# Patient Record
Sex: Female | Born: 2005 | Hispanic: Yes | Marital: Single | Smoking: Never smoker
Health system: Southern US, Community
[De-identification: ages and names within clinical notes are randomized; demographics above are authoritative.]

## PROBLEM LIST (undated history)

## (undated) HISTORY — PX: CHOLECYSTECTOMY: SHX55

---
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
## 2023-11-18 ENCOUNTER — Inpatient Hospital Stay: Payer: MEDICAID

## 2023-11-18 ENCOUNTER — Other Ambulatory Visit: Payer: Self-pay

## 2023-11-18 ENCOUNTER — Inpatient Hospital Stay
Admission: EM | Admit: 2023-11-18 | Discharge: 2023-11-25 | DRG: 418 | Disposition: A | Discharge status: Home / Self Care | Payer: MEDICAID | Attending: Student | Admitting: Student

## 2023-11-18 ENCOUNTER — Emergency Department: Payer: MEDICAID

## 2023-11-18 DIAGNOSIS — I959 Hypotension, unspecified: Secondary | ICD-10-CM | POA: Diagnosis not present

## 2023-11-18 DIAGNOSIS — Z79899 Other long term (current) drug therapy: Secondary | ICD-10-CM

## 2023-11-18 DIAGNOSIS — E559 Vitamin D deficiency, unspecified: Secondary | ICD-10-CM | POA: Diagnosis present

## 2023-11-18 DIAGNOSIS — Z6836 Body mass index (BMI) 36.0-36.9, adult: Secondary | ICD-10-CM

## 2023-11-18 DIAGNOSIS — F79 Unspecified intellectual disabilities: Secondary | ICD-10-CM | POA: Diagnosis present

## 2023-11-18 DIAGNOSIS — R7401 Elevation of levels of liver transaminase levels: Secondary | ICD-10-CM | POA: Diagnosis present

## 2023-11-18 DIAGNOSIS — J9811 Atelectasis: Secondary | ICD-10-CM | POA: Diagnosis not present

## 2023-11-18 DIAGNOSIS — K219 Gastro-esophageal reflux disease without esophagitis: Secondary | ICD-10-CM | POA: Diagnosis present

## 2023-11-18 DIAGNOSIS — E66812 Obesity, class 2: Secondary | ICD-10-CM | POA: Diagnosis present

## 2023-11-18 DIAGNOSIS — E611 Iron deficiency: Secondary | ICD-10-CM | POA: Diagnosis present

## 2023-11-18 DIAGNOSIS — Z23 Encounter for immunization: Secondary | ICD-10-CM

## 2023-11-18 DIAGNOSIS — K2980 Duodenitis without bleeding: Secondary | ICD-10-CM | POA: Diagnosis present

## 2023-11-18 DIAGNOSIS — K8012 Calculus of gallbladder with acute and chronic cholecystitis without obstruction: Secondary | ICD-10-CM | POA: Diagnosis not present

## 2023-11-18 DIAGNOSIS — K8010 Calculus of gallbladder with chronic cholecystitis without obstruction: Secondary | ICD-10-CM | POA: Diagnosis present

## 2023-11-18 DIAGNOSIS — R7989 Other specified abnormal findings of blood chemistry: Secondary | ICD-10-CM

## 2023-11-18 DIAGNOSIS — K8020 Calculus of gallbladder without cholecystitis without obstruction: Secondary | ICD-10-CM | POA: Diagnosis present

## 2023-11-18 DIAGNOSIS — K8018 Calculus of gallbladder with other cholecystitis without obstruction: Secondary | ICD-10-CM | POA: Diagnosis not present

## 2023-11-18 DIAGNOSIS — K851 Biliary acute pancreatitis without necrosis or infection: Principal | ICD-10-CM | POA: Diagnosis present

## 2023-11-18 DIAGNOSIS — R188 Other ascites: Secondary | ICD-10-CM | POA: Diagnosis present

## 2023-11-18 DIAGNOSIS — R000 Tachycardia, unspecified: Secondary | ICD-10-CM | POA: Diagnosis not present

## 2023-11-18 DIAGNOSIS — E876 Hypokalemia: Secondary | ICD-10-CM | POA: Diagnosis present

## 2023-11-18 LAB — COMPREHENSIVE METABOLIC PANEL WITH GFR
ALT: 244 U/L — ABNORMAL HIGH (ref 0–44)
AST: 181 U/L — ABNORMAL HIGH (ref 15–41)
Albumin: 4.2 g/dL (ref 3.5–5.0)
Alkaline Phosphatase: 149 U/L — ABNORMAL HIGH (ref 38–126)
Anion gap: 11 (ref 5–15)
BUN: 8 mg/dL (ref 6–20)
CO2: 23 mmol/L (ref 22–32)
Calcium: 9.1 mg/dL (ref 8.9–10.3)
Chloride: 106 mmol/L (ref 98–111)
Creatinine, Ser: 0.69 mg/dL (ref 0.44–1.00)
GFR, Estimated: >60 mL/min (ref >60)
Glucose, Bld: 174 mg/dL — ABNORMAL HIGH (ref 70–99)
Potassium: 3.9 mmol/L (ref 3.5–5.1)
Sodium: 140 mmol/L (ref 135–145)
Total Bilirubin: 5.3 mg/dL — ABNORMAL HIGH (ref 0.0–1.2)
Total Protein: 8.1 g/dL (ref 6.5–8.1)

## 2023-11-18 LAB — CBC
HCT: 45.7 % (ref 36.0–46.0)
HCT: 43.6 % (ref 36.0–46.0)
Hemoglobin: 14.3 g/dL (ref 12.0–15.0)
Hemoglobin: 13.7 g/dL (ref 12.0–15.0)
MCH: 31 pg (ref 26.0–34.0)
MCH: 31.1 pg (ref 26.0–34.0)
MCHC: 31.3 g/dL (ref 30.0–36.0)
MCHC: 31.4 g/dL (ref 30.0–36.0)
MCV: 99.1 fL (ref 80.0–100.0)
MCV: 98.9 fL (ref 80.0–100.0)
Platelets: 226 K/uL (ref 150–400)
Platelets: 217 K/uL (ref 150–400)
RBC: 4.61 MIL/uL (ref 3.87–5.11)
RBC: 4.41 MIL/uL (ref 3.87–5.11)
RDW: 13.7 % (ref 11.5–15.5)
RDW: 13.8 % (ref 11.5–15.5)
WBC: 16 K/uL — ABNORMAL HIGH (ref 4.0–10.5)
WBC: 17.5 K/uL — ABNORMAL HIGH (ref 4.0–10.5)
nRBC: 0 % (ref 0.0–0.2)
nRBC: 0 % (ref 0.0–0.2)

## 2023-11-18 LAB — PROTIME-INR
INR: 1.1 (ref 0.8–1.2)
Prothrombin Time: 14.4 s (ref 11.4–15.2)

## 2023-11-18 LAB — SALICYLATE LEVEL: Salicylate Lvl: <7 mg/dL — ABNORMAL LOW (ref 7.0–30.0)

## 2023-11-18 LAB — ACETAMINOPHEN LEVEL: Acetaminophen (Tylenol), Serum: <10 ug/mL — ABNORMAL LOW (ref 10–30)

## 2023-11-18 LAB — HEPATITIS PANEL, ACUTE
HCV Ab: NONREACTIVE
Hep A IgM: NONREACTIVE
Hep B C IgM: NONREACTIVE
Hepatitis B Surface Ag: NONREACTIVE

## 2023-11-18 LAB — CREATININE, SERUM
Creatinine, Ser: 0.62 mg/dL (ref 0.44–1.00)
GFR, Estimated: >60 mL/min (ref >60)

## 2023-11-18 LAB — ETHANOL: Alcohol, Ethyl (B): <15 mg/dL (ref <15)

## 2023-11-18 LAB — BILIRUBIN, DIRECT: Bilirubin, Direct: 3 mg/dL — ABNORMAL HIGH (ref 0.0–0.2)

## 2023-11-18 LAB — LIPASE, BLOOD: Lipase: >2800 U/L — ABNORMAL HIGH (ref 11–51)

## 2023-11-18 MED ORDER — SODIUM CHLORIDE 0.9 % IV SOLN: INTRAVENOUS | Status: DC

## 2023-11-18 MED ORDER — SODIUM CHLORIDE 0.9 % IV BOLUS
1000.0000 mL | Freq: Once | INTRAVENOUS | Status: AC
  Administered 2023-11-18: 1000 mL via INTRAVENOUS

## 2023-11-18 MED ORDER — GADOBUTROL 1 MMOL/ML IV SOLN
7.0000 mL | Freq: Once | INTRAVENOUS | Status: AC | PRN
  Administered 2023-11-19: 7 mL via INTRAVENOUS

## 2023-11-18 MED ORDER — ONDANSETRON HCL 4 MG/2ML IJ SOLN
4.0000 mg | Freq: Four times a day (QID) | INTRAMUSCULAR | Status: DC | PRN
  Administered 2023-11-19 – 2023-11-23 (×8): 4 mg via INTRAVENOUS
  Filled 2023-11-18 (×8): qty 2

## 2023-11-18 MED ORDER — PANTOPRAZOLE SODIUM 40 MG IV SOLR
40.0000 mg | Freq: Once | INTRAVENOUS | Status: AC
  Administered 2023-11-18: 40 mg via INTRAVENOUS
  Filled 2023-11-18: qty 10

## 2023-11-18 MED ORDER — HYDROMORPHONE HCL 1 MG/ML IJ SOLN
0.5000 mg | Freq: Once | INTRAMUSCULAR | Status: AC
  Administered 2023-11-18: 0.5 mg via INTRAVENOUS
  Filled 2023-11-18: qty 0.5

## 2023-11-18 MED ORDER — PIPERACILLIN-TAZOBACTAM 3.375 G IVPB
3.3750 g | Freq: Three times a day (TID) | INTRAVENOUS | Status: DC
  Administered 2023-11-19 – 2023-11-25 (×20): 3.375 g via INTRAVENOUS
  Filled 2023-11-18 (×20): qty 50

## 2023-11-18 MED ORDER — MORPHINE SULFATE (PF) 2 MG/ML IV SOLN
2.0000 mg | INTRAVENOUS | Status: DC | PRN
  Administered 2023-11-18: 2 mg via INTRAVENOUS
  Filled 2023-11-18: qty 1

## 2023-11-18 MED ORDER — ACETAMINOPHEN 325 MG PO TABS
650.0000 mg | ORAL_TABLET | Freq: Four times a day (QID) | ORAL | Status: DC | PRN
  Administered 2023-11-20 – 2023-11-24 (×8): 650 mg via ORAL
  Filled 2023-11-18 (×8): qty 2

## 2023-11-18 MED ORDER — ENOXAPARIN SODIUM 40 MG/0.4ML IJ SOSY
40.0000 mg | PREFILLED_SYRINGE | INTRAMUSCULAR | Status: DC
  Administered 2023-11-18 – 2023-11-24 (×7): 40 mg via SUBCUTANEOUS
  Filled 2023-11-18 (×7): qty 0.4

## 2023-11-18 MED ORDER — HYDROMORPHONE HCL 1 MG/ML IJ SOLN
0.5000 mg | INTRAMUSCULAR | Status: DC | PRN
  Administered 2023-11-19 (×3): 0.5 mg via INTRAVENOUS
  Filled 2023-11-18 (×3): qty 0.5

## 2023-11-18 MED ORDER — PIPERACILLIN-TAZOBACTAM 3.375 G IVPB 30 MIN
3.3750 g | Freq: Once | INTRAVENOUS | Status: AC
  Administered 2023-11-18: 3.375 g via INTRAVENOUS
  Filled 2023-11-18: qty 50

## 2023-11-18 MED ORDER — ONDANSETRON HCL 4 MG/2ML IJ SOLN
4.0000 mg | Freq: Once | INTRAMUSCULAR | Status: AC
  Administered 2023-11-18: 4 mg via INTRAVENOUS
  Filled 2023-11-18: qty 2

## 2023-11-18 NOTE — ED Notes (Addendum)
 RN completed pt protocols. Pt walked out of bathroom into the triage area. Pt is currently screaming and yelling triage that she is in pain. Pt spit in a emesis bag and then threw it on the floor. RN asked pt to please pick up bag, pt sts that she is not able to move and couldn't pick it up. RN asked pt again to pick it up, pt screamed it hurts and then picked up the bag. Pt is moaning and screaming. No tears or physical distress is visualized. VS are WNL

## 2023-11-18 NOTE — ED Triage Notes (Signed)
 First nurse note: Pt to ED via [facility] from home. Pt reports is not suppose to eat spicy foods and at [place] 2hrs ago and having abd pain. Pt groaning in pain. Pt has not been med compliant.    168/80 HR 70 97% RA
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
## 2023-11-18 NOTE — ED Notes (Addendum)
 Patient uncooperative when being asked assessment questions. Mom states sometimes the patient acts like this right before her menstrual cycle. Patient mother states she has a Nexplanon and does not get periods. Patient is groaning in pain, but will not verbalize where the pain is at.

## 2023-11-18 NOTE — H&P (Signed)
 History and Physical    Patient: Teresa Rasmussen FMW:[id number] DOB: 09-Feb-2006 DOA: 11/18/2023 DOS: the patient was seen and examined on 11/18/2023 PCP: [facility]  Patient coming from: Home  Chief Complaint:  Chief Complaint  Patient presents with   Abdominal Pain   HPI: Teresa Rasmussen is a 18 y.o. female with medical history significant of  intellectual disability who presented to the hospital with complaint of abdominal pain. According to her mother, the patient ate some chips(Takis) overnight and complained of abdominal pain prior to going to bed. However it appeared that her symptoms had resolved. Today the patient woke and stated that she had no eaten and started to have cramping abdominal pain which started in the top of her belly. She states that the pain was persistent and associated with nausea. Her sister was present at home with her and decided to call the ambulance and have her brought to the hospital for further evaluation.  In Er, BP 139/92, HR 79, RR 19, Spo2 100% on RA and Tmax 98.4. wbc 16, Hb.hct 14.3/45.7, plt 226. Na 140, K 3.9, Cl 106, bicarb 23, Bun/cr 8/0.69 and glucose 174. Alk phos 149, AST/ ALT 181/244  direct bilirubin 3 and total bilirubin 5.3. Acetaminophen  <10 and salicylate <7.  Abdominal ultrasound which demonstrated cholecystolithiasis with small volume biliary sludge. No changes of acute cholecystitis.  Review of Systems: As mentioned in the history of present illness. All other systems reviewed and are negative. History reviewed. No pertinent past medical history. History reviewed. No pertinent surgical history. Social History:  reports that she has never smoked. She has never used smokeless tobacco. She reports that she does not drink alcohol. No history on file for drug use.  No Known Allergies  History reviewed. No pertinent family history.  Prior to Admission medications   Medication Sig Start Date End Date Taking?  Authorizing Provider  MIRALAX 17 GM/SCOOP powder Take 17 g by mouth daily. 11/05/23  Yes [provider]  omeprazole (PRILOSEC) 20 MG capsule Take 20 mg by mouth 2 (two) times daily. 08/07/23  Yes [provider]  sertraline  (ZOLOFT ) 50 MG tablet Take 50 mg by mouth daily. 11/05/23  Yes [provider]    Physical Exam: Vitals:   11/18/23 1814 11/18/23 1819 11/18/23 1840 11/18/23 1914  BP:  128/88  (!) 139/92  Pulse:  62  79  Resp:  15  19  Temp: 98.4 F (36.9 C) 98.4 F (36.9 C)  98.4 F (36.9 C)  TempSrc: Oral Oral  Oral  SpO2:  100%  100%  Weight:   78.4 kg    Constitutional:      General: He is not in acute distress.    Appearance: Normal appearance.  HENT:     Head: Normocephalic and atraumatic.  Eyes:     Extraocular Movements: Extraocular movements intact.     Conjunctiva/sclera: Conjunctivae normal.     Pupils: Pupils are equal, round, and reactive to light.  Cardiovascular:     Rate and Rhythm: Normal rate and regular rhythm.     Pulses: Normal pulses.     Heart sounds: Normal heart sounds.  Pulmonary:     Effort: Pulmonary effort is normal. No respiratory distress.     Breath sounds: Normal breath sounds. No wheezing, rhonchi or rales.  Abdominal:     General: Abdomen is flat. Bowel sounds are normal. There is no distension.     Palpations: Abdomen is soft.  Tenderness: There was tenderness on palpation of the right upper quadrant  Musculoskeletal:        General: No deformity. Normal range of motion.  Skin:    General: Skin is warm and dry.     Coloration: Skin is not jaundiced.  Neurological:     General: No focal deficit present.     Mental Status: He is alert and oriented to person, place, and time. Mental status is at baseline.  Data Reviewed:  All results are documented above  Assessment and Plan: Patient Active Problem List   Diagnosis Date Noted   Cholelithiasis 11/18/2023     Cholelithiasis Patient will have MRCP  performed Currently started on zosyn  at this time Consult placed to gastroenterology and general surgery Patient has pain medication currently on board  Intellectual disability Somewhat complicates care     Advance Care Planning:   Code Status: Full Code   Consults: gastroenterology and general surgery   Family Communication:   Mitcheal Holly (Mother) [phone] (Home Phone)    Severity of Illness: The appropriate patient status for this patient is INPATIENT. Inpatient status is judged to be reasonable and necessary in order to provide the required intensity of service to ensure the patient's safety. The patient's presenting symptoms, physical exam findings, and initial radiographic and laboratory data in the context of their chronic comorbidities is felt to place them at high risk for further clinical deterioration. Furthermore, it is not anticipated that the patient will be medically stable for discharge from the hospital within 2 midnights of admission.   * I certify that at the point of admission it is my clinical judgment that the patient will require inpatient hospital care spanning beyond 2 midnights from the point of admission due to high intensity of service, high risk for further deterioration and high frequency of surveillance required.*  Author: Kalab Camps K Shali Vesey, MD 11/18/2023 7:41 PM  For on call review [email or web address].

## 2023-11-19 ENCOUNTER — Inpatient Hospital Stay: Payer: MEDICAID

## 2023-11-19 DIAGNOSIS — K851 Biliary acute pancreatitis without necrosis or infection: Secondary | ICD-10-CM | POA: Diagnosis not present

## 2023-11-19 DIAGNOSIS — K8020 Calculus of gallbladder without cholecystitis without obstruction: Secondary | ICD-10-CM | POA: Diagnosis not present

## 2023-11-19 LAB — URINALYSIS, ROUTINE W REFLEX MICROSCOPIC
Bilirubin Urine: NEGATIVE
Glucose, UA: NEGATIVE mg/dL
Hgb urine dipstick: NEGATIVE
Ketones, ur: 20 mg/dL — AB
Leukocytes,Ua: NEGATIVE
Nitrite: NEGATIVE
Protein, ur: NEGATIVE mg/dL
Specific Gravity, Urine: 1.016 (ref 1.005–1.030)
pH: 5 (ref 5.0–8.0)

## 2023-11-19 LAB — BASIC METABOLIC PANEL WITH GFR
Anion gap: 13 (ref 5–15)
BUN: 10 mg/dL (ref 6–20)
CO2: 23 mmol/L (ref 22–32)
Calcium: 8.7 mg/dL — ABNORMAL LOW (ref 8.9–10.3)
Chloride: 107 mmol/L (ref 98–111)
Creatinine, Ser: 0.73 mg/dL (ref 0.44–1.00)
GFR, Estimated: >60 mL/min (ref >60)
Glucose, Bld: 109 mg/dL — ABNORMAL HIGH (ref 70–99)
Potassium: 3.8 mmol/L (ref 3.5–5.1)
Sodium: 143 mmol/L (ref 135–145)

## 2023-11-19 LAB — HEPATIC FUNCTION PANEL
ALT: 179 U/L — ABNORMAL HIGH (ref 0–44)
AST: 74 U/L — ABNORMAL HIGH (ref 15–41)
Albumin: 4 g/dL (ref 3.5–5.0)
Alkaline Phosphatase: 134 U/L — ABNORMAL HIGH (ref 38–126)
Bilirubin, Direct: 0.6 mg/dL — ABNORMAL HIGH (ref 0.0–0.2)
Indirect Bilirubin: 1.4 mg/dL — ABNORMAL HIGH (ref 0.3–0.9)
Total Bilirubin: 2 mg/dL — ABNORMAL HIGH (ref 0.0–1.2)
Total Protein: 8 g/dL (ref 6.5–8.1)

## 2023-11-19 LAB — LIPID PANEL
Cholesterol: 152 mg/dL (ref 0–200)
HDL: 30 mg/dL — ABNORMAL LOW (ref >40)
LDL Cholesterol: 102 mg/dL — ABNORMAL HIGH (ref 0–99)
Total CHOL/HDL Ratio: 5.1 ratio
Triglycerides: 99 mg/dL (ref <150)
VLDL: 20 mg/dL (ref 0–40)

## 2023-11-19 LAB — URINE DRUG SCREEN, QUALITATIVE (ARMC ONLY)
Amphetamines, Ur Screen: NOT DETECTED
Barbiturates, Ur Screen: NOT DETECTED
Benzodiazepine, Ur Scrn: NOT DETECTED
Cannabinoid 50 Ng, Ur ~~LOC~~: NOT DETECTED
Cocaine Metabolite,Ur ~~LOC~~: NOT DETECTED
MDMA (Ecstasy)Ur Screen: NOT DETECTED
Methadone Scn, Ur: NOT DETECTED
Opiate, Ur Screen: POSITIVE — AB
Phencyclidine (PCP) Ur S: NOT DETECTED
Tricyclic, Ur Screen: NOT DETECTED

## 2023-11-19 LAB — CBC
HCT: 44.2 % (ref 36.0–46.0)
Hemoglobin: 14.1 g/dL (ref 12.0–15.0)
MCH: 31.1 pg (ref 26.0–34.0)
MCHC: 31.9 g/dL (ref 30.0–36.0)
MCV: 97.6 fL (ref 80.0–100.0)
Platelets: 190 K/uL (ref 150–400)
RBC: 4.53 MIL/uL (ref 3.87–5.11)
RDW: 14.2 % (ref 11.5–15.5)
WBC: 19.6 K/uL — ABNORMAL HIGH (ref 4.0–10.5)
nRBC: 0 % (ref 0.0–0.2)

## 2023-11-19 LAB — PHOSPHORUS: Phosphorus: 2.7 mg/dL (ref 2.5–4.6)

## 2023-11-19 LAB — LIPASE, BLOOD: Lipase: 1835 U/L — ABNORMAL HIGH (ref 11–51)

## 2023-11-19 LAB — PROCALCITONIN: Procalcitonin: 0.22 ng/mL

## 2023-11-19 LAB — MAGNESIUM: Magnesium: 2 mg/dL (ref 1.7–2.4)

## 2023-11-19 LAB — VITAMIN B12: Vitamin B-12: 289 pg/mL (ref 180–914)

## 2023-11-19 LAB — HIV ANTIBODY (ROUTINE TESTING W REFLEX): HIV Screen 4th Generation wRfx: NONREACTIVE

## 2023-11-19 LAB — FOLATE: Folate: 16.5 ng/mL (ref >5.9)

## 2023-11-19 MED ORDER — OXYCODONE HCL 5 MG PO TABS
5.0000 mg | ORAL_TABLET | Freq: Four times a day (QID) | ORAL | Status: DC | PRN
  Administered 2023-11-19 – 2023-11-25 (×14): 5 mg via ORAL
  Filled 2023-11-19 (×14): qty 1

## 2023-11-19 MED ORDER — SERTRALINE HCL 50 MG PO TABS
50.0000 mg | ORAL_TABLET | Freq: Every day | ORAL | Status: DC
  Administered 2023-11-19 – 2023-11-25 (×7): 50 mg via ORAL
  Filled 2023-11-19 (×9): qty 1

## 2023-11-19 MED ORDER — SODIUM CHLORIDE 0.9 % IV SOLN: INTRAVENOUS | Status: DC

## 2023-11-19 MED ORDER — DOCUSATE SODIUM 100 MG PO CAPS
100.0000 mg | ORAL_CAPSULE | Freq: Every day | ORAL | Status: DC | PRN
  Administered 2023-11-19 – 2023-11-21 (×2): 100 mg via ORAL
  Filled 2023-11-19 (×3): qty 1

## 2023-11-19 MED ORDER — INFLUENZA VIRUS VACC SPLIT PF (FLUZONE) 0.5 ML IM SUSY
0.5000 mL | PREFILLED_SYRINGE | INTRAMUSCULAR | Status: AC
  Administered 2023-11-22: 0.5 mL via INTRAMUSCULAR
  Filled 2023-11-19: qty 0.5

## 2023-11-19 MED ORDER — PANTOPRAZOLE SODIUM 40 MG IV SOLR
40.0000 mg | INTRAVENOUS | Status: DC
  Administered 2023-11-19 – 2023-11-22 (×4): 40 mg via INTRAVENOUS
  Filled 2023-11-19 (×4): qty 10

## 2023-11-19 MED ORDER — SODIUM CHLORIDE 0.9 % IV SOLN
INTRAVENOUS | Status: DC
  Administered 2023-11-20: 1000 mL via INTRAVENOUS

## 2023-11-19 MED ORDER — HYDROMORPHONE HCL 1 MG/ML IJ SOLN
0.5000 mg | INTRAMUSCULAR | Status: DC | PRN
  Administered 2023-11-19 – 2023-11-25 (×15): 0.5 mg via INTRAVENOUS
  Filled 2023-11-19 (×14): qty 0.5

## 2023-11-19 NOTE — Consult Note (Signed)
 [facility] SURGICAL CONSULTATION NOTE (initial) - cpt: 00746   HISTORY OF PRESENT ILLNESS (HPI):  18 y.o. female presented to [facility] ED yesterday for evaluation of abdominal pain. Patient reports epigastric abdominal pain since seeing a cramping sensation.  Seem to have followed eating chips, with abdominal pain at going to bed and awoke with pain persisting. Pain is perceived as nonradiating to the shoulder.  Currently per reports she has depended on pain medication which would not completely control her pain without frequent administration.  Lipase has diminished since admission however remains very high at >1800.   RCP has been obtained which confirms the presence of apparently uncomplicated acute pancreatitis diffusely, and yet with edema that involves the region of the gallbladder neck and bile ducts. Surgery is consulted by hospitalist physician Dr. Von in this context for evaluation and management of biliary pancreatitis, cholelithiasis.  PAST MEDICAL HISTORY (PMH):  History reviewed. No pertinent past medical history.   PAST SURGICAL HISTORY (PSH):  History reviewed. No pertinent surgical history.   MEDICATIONS:  Prior to Admission medications   Medication Sig Start Date End Date Taking? Authorizing Provider  MIRALAX 17 GM/SCOOP powder Take 17 g by mouth daily. 11/05/23  Yes [provider]  omeprazole (PRILOSEC) 20 MG capsule Take 20 mg by mouth 2 (two) times daily. 08/07/23  Yes [provider]  sertraline  (ZOLOFT ) 50 MG tablet Take 50 mg by mouth daily. 11/05/23  Yes [provider]     ALLERGIES:  No Known Allergies   SOCIAL HISTORY:  Social History   Socioeconomic History   Marital status: Single    Spouse name: Not on file   Number of children: Not on file   Years of education: Not on file   Highest education level: Not on file  Occupational History   Not on file  Tobacco Use   Smoking status: Never   Smokeless tobacco:  Never  Substance and Sexual Activity   Alcohol use: No   Drug use: Not on file   Sexual activity: Not on file  Other Topics Concern   Not on file  Social History Narrative   Not on file   Social Drivers of Health   Financial Resource Strain: Not on file  Food Insecurity: No Food Insecurity (11/18/2023)   Hunger Vital Sign    Worried About Running Out of Food in the Last Year: Never true    Ran Out of Food in the Last Year: Never true  Transportation Needs: No Transportation Needs (11/18/2023)   PRAPARE - Administrator, Civil Service (Medical): No    Lack of Transportation (Non-Medical): No  Physical Activity: Not on file  Stress: Not on file  Social Connections: Not on file  Intimate Partner Violence: Not At Risk (11/18/2023)   Humiliation, Afraid, Rape, and Kick questionnaire    Fear of Current or Ex-Partner: No    Emotionally Abused: No    Physically Abused: No    Sexually Abused: No     FAMILY HISTORY:  History reviewed. No pertinent family history.    REVIEW OF SYSTEMS:  Review of Systems  All other systems reviewed and are negative.   VITAL SIGNS:  Temp:  [97.5 F (36.4 C)-99.6 F (37.6 C)] 98.3 F (36.8 C) (09/24 1945) Pulse Rate:  [85-105] 99 (09/24 1945) Resp:  [16-18] 16 (09/24 1945) BP: (108-128)/(67-85) 108/67 (09/24 1945) SpO2:  [97 %-100 %] 99 % (09/24 1945)     Height: 4' 10 (147.3  cm) Weight: 78.4 kg     INTAKE/OUTPUT:  09/23 0701 - 09/24 0700 In: 1100 [IV Piggyback:1100] Out: -   PHYSICAL EXAM:  Physical Exam Blood pressure 108/67, pulse 99, temperature 98.3 F (36.8 C), temperature source Oral, resp. rate 16, height 4' 10 (1.473 m), weight 78.4 kg, SpO2 99%. Last Weight  Most recent update: 11/18/2023  6:41 PM    Weight  78.4 kg (172 lb 14.4 oz)             CONSTITUTIONAL: Well developed, and nourished, appropriately responsive and aware without distress.  Eating ice chips on presentation. EYES: Sclera non-icteric.    EARS, NOSE, MOUTH AND THROAT: The oropharynx is clear. Oral mucosa is pink and moist.   Hearing is intact to voice.  NECK: Trachea is midline, and there is no jugular venous distension.  LYMPH NODES:  Lymph nodes in the neck are not enlarged. RESPIRATORY:  Lungs are clear, and breath sounds are equal bilaterally.   Normal respiratory effort without pathologic use of accessory muscles. CARDIOVASCULAR: Heart is regular in rate and rhythm.   Well perfused.  GI: The abdomen is tender in the epigastrium and right upper quadrant, otherwise lower abdomen is soft, nontender, and nondistended. There were no palpable masses. I did not appreciate hepatosplenomegaly. There were normal bowel sounds. MUSCULOSKELETAL:  Symmetrical muscle tone appreciated in all four extremities. Warm without edema.  SKIN: Skin turgor is normal. No pathologic skin lesions appreciated.  NEUROLOGIC:  Motor and sensation appear grossly normal.  Cranial nerves are grossly without defect. PSYCH:  Alert and oriented to person, place and time. Affect is appropriate for situation.  Data Reviewed I have personally reviewed what is currently available of the patient's imaging, recent labs and medical records.    Labs:     Latest Ref Rng & Units 11/19/2023   10:12 AM 11/18/2023    7:20 PM 11/18/2023    2:17 PM  CBC  WBC 4.0 - 10.5 K/uL 19.6  17.5  16.0   Hemoglobin 12.0 - 15.0 g/dL 85.8  86.2  85.6   Hematocrit 36.0 - 46.0 % 44.2  43.6  45.7   Platelets 150 - 400 K/uL 190  217  226       Latest Ref Rng & Units 11/19/2023   10:12 AM 11/18/2023    7:20 PM 11/18/2023    2:17 PM  CMP  Glucose 70 - 99 mg/dL 890   825   BUN 6 - 20 mg/dL 10   8   Creatinine 9.55 - 1.00 mg/dL 9.26  9.37  9.30   Sodium 135 - 145 mmol/L 143   140   Potassium 3.5 - 5.1 mmol/L 3.8   3.9   Chloride 98 - 111 mmol/L 107   106   CO2 22 - 32 mmol/L 23   23   Calcium 8.9 - 10.3 mg/dL 8.7   9.1   Total Protein 6.5 - 8.1 g/dL 8.0   8.1   Total Bilirubin 0.0 -  1.2 mg/dL 2.0   5.3   Alkaline Phos 38 - 126 U/L 134   149   AST 15 - 41 U/L 74   181   ALT 0 - 44 U/L 179   244      Imaging studies:   Last 24 hrs: MR ABDOMEN MRCP W WO CONTAST Result Date: 11/19/2023 CLINICAL DATA:  Cholelithiasis. Follow-up MRCP inpatient with cholelithiasis. EXAM: MRI ABDOMEN WITHOUT AND WITH CONTRAST (INCLUDING MRCP) TECHNIQUE: Multiplanar multisequence MR  imaging of the abdomen was performed both before and after the administration of intravenous contrast. Heavily T2-weighted images of the biliary and pancreatic ducts were obtained, and three-dimensional MRCP images were rendered by post processing. CONTRAST:  7mL GADAVIST  GADOBUTROL  1 MMOL/ML IV SOLN COMPARISON:  Ultrasound abdomen from 11/18/2023. FINDINGS: Lower chest: Unremarkable MR appearance to the lung bases. No pleural effusion. No pericardial effusion. Normal heart size. Hepatobiliary: The liver is normal in size. Noncirrhotic configuration. No focal mass. No intrahepatic or extrahepatic bile duct dilatation. No choledocholithiasis. Physiologically distended gallbladder. Small-to-moderate volume dependent gallstones and sludge. No abnormal wall thickening. There is trace pericholecystic fat stranding and fluid near the neck, which is likely secondary to pancreatitis described below. Pancreas: There is bulky and slightly heterogeneous pancreas exhibiting moderate peripancreatic fat stranding and fluid which extends along the bilateral paracolic gutters. No walled-off abscess. There is normal inhomogeneous enhancement of the entire pancreas, thereby excluding pancreatic necrosis. No suspicious pancreatic lesion. Main pancreatic duct is not dilated. The splenic vein, superior mesenteric vein and portal veins are patent and without significant stenosis. Spleen:  Within normal limits in size and appearance. No focal mass. Adrenals/Urinary Tract: Unremarkable adrenal glands. No hydroureteronephrosis. No suspicious renal mass.  Stomach/Bowel: Visualized portions within the abdomen are unremarkable. No disproportionate dilation of bowel loops. Vascular/Lymphatic: No pathologically enlarged lymph nodes identified. No abdominal aortic aneurysm demonstrated. There is mild ascites. Other: Limited evaluation of uterus and bilateral ovaries on coronal images, which appears grossly within normal limits. Musculoskeletal: No suspicious bone lesions identified. IMPRESSION: 1. Findings compatible with acute pancreatitis. No walled-off abscess or pancreatic necrosis. 2. Cholelithiasis without acute cholecystitis. No intra or extrahepatic bile duct dilation. No choledocholithiasis. 3. Mild ascites. Electronically Signed   By: Ree Molt M.D.   On: 11/19/2023 08:58     Assessment/Plan:  18 y.o. female with hostel pancreatitis, complicated by:  Patient Active Problem List   Diagnosis Date Noted   Cholelithiasis 11/18/2023    - She remains quite tender and with significant pain from her pancreatitis, therefore we will not consider elective cholecystectomy until further pain resolution and therefore clinical resolution of her pancreatitis has occurred.  The improvement in her lipase overnight is encouraging we will continue to follow.  - I do not believe she has to be kept n.p.o. or limited ice chips at this time and I have ordered a clear liquid diet.  - Labs anticipated for morning.  - DVT prophylaxis  All of the above findings and recommendations were discussed with the patient and  family(if present), and all of patient's and present family's questions were answered to their expressed satisfaction.  I personally spent a total of 45 minutes in the care of the patient today including preparing to see the patient, getting/reviewing separately obtained history, performing a medically appropriate exam/evaluation, placing orders, referring and communicating with other health care professionals, documenting clinical information in the  EHR, and independently interpreting results.    Thank you for the opportunity to participate in this patient's care.   -- Honor Leghorn, M.D., FACS 11/19/2023, 9:46 PM

## 2023-11-19 NOTE — Progress Notes (Signed)
 [facility] Progress Note  Patient: Teresa Rasmussen    FMW:[id number]  DOA: 11/18/2023     Date of Service: the patient was seen and examined on 11/19/2023  Chief Complaint  Patient presents with   Abdominal Pain   Brief hospital course: This is 18 years old female with intellectual disability, Nexplanon in place presented in the [facility] ED with complaining of abdominal pain.  ED workup: VS stable, afebrile BMP: BG 124 elevated Lipase >2800 Alk phos 149, AST 181, ALT 244 elevated.  TP 5.3, direct bili 3.0 CBC: Leukocytosis WBC 17.5 Hepatitis panel negative, EtOH <15, UA negative US  Abd: Cholecystolithiasis with small volume biliary sludge. No changes of acute cholecystitis. MRCP: 1. Findings compatible with acute pancreatitis. No walled-off abscess or pancreatic necrosis. 2. Cholelithiasis without acute cholecystitis. No intra or extrahepatic bile duct dilation. No choledocholithiasis. 3. Mild ascites.   Assessment and Plan:  # Cholelithiasis Continue as needed medication for pain control Keep n.p.o. Continue IV fluid General Surgery consulted   # Acute pancreatitis Continue prn meds for pain control Keep n.p.o. Continue IV fluid for hydration Trend lipase level   # Transaminitis secondary to cholelithiasis # Leukocytosis most likely reactive Procalcitonin negative Continue empiric antibiotics for now Trend LFTs and WBC count  # GERD on PPI  # Obesity class II Body mass index is 36.14 kg/m.  Interventions:Calorie restricted diet and daily exercise advised to lose body weight.  Lifestyle modification discussed.   Diet: NPO DVT Prophylaxis: Subcutaneous Lovenox    Advance goals of care discussion: Full code  Family Communication: family was present at bedside, at the time of interview.  The pt provided permission to discuss medical plan with the family. Opportunity was given to ask question and all questions were answered satisfactorily.   Disposition:   Pt is from Home, admitted with cholelithiasis, pancreatitis and transaminitis, still has intractable pain and electrolyte imbalance, which precludes a safe discharge. Discharge to home, when stable, may need few days to improve.  Subjective: No significant events overnight, patient is still having intractable pain which is getting better with IV Dilaudid .  Patient is still feeling nauseous, denied any chest pain or palpitations, no shortness of breath.  Physical Exam: General: NAD, lying comfortably Appear in no distress, affect appropriate Eyes: PERRLA ENT: Oral Mucosa Clear, moist  Neck: no JVD,  Cardiovascular: S1 and S2 Present, no Murmur,  Respiratory: good respiratory effort, Bilateral Air entry equal and Decreased, no Crackles, no wheezes Abdomen: BS present, Soft and mild epigastric/ RUQ tenderness,  Skin: no rashes Extremities: no Pedal edema, no calf tenderness Neurologic: without any new focal findings Gait not checked due to patient safety concerns  Vitals:   11/18/23 1914 11/18/23 2017 11/19/23 0300 11/19/23 0914  BP: (!) 139/92 118/80 109/75 128/83  Pulse: 79 65 85 (!) 105  Resp: 19 20 16 18   Temp: 98.4 F (36.9 C) 98.6 F (37 C) 98.4 F (36.9 C) 99.6 F (37.6 C)  TempSrc: Oral Oral Oral Oral  SpO2: 100% 100% 100% 99%  Weight:      Height:  4' 10 (1.473 m)      Intake/Output Summary (Last 24 hours) at 11/19/2023 1417 Last data filed at 11/18/2023 1907 Gross per 24 hour  Intake 1100 ml  Output --  Net 1100 ml   Filed Weights   11/18/23 1840  Weight: 78.4 kg    Data Reviewed: I have personally reviewed and interpreted daily labs, tele strips, imagings as discussed above. I reviewed all nursing  notes, pharmacy notes, vitals, pertinent old records I have discussed plan of care as described above with RN and patient/family.  CBC: Recent Labs  Lab 11/18/23 1417 11/18/23 1920 11/19/23 1012  WBC 16.0* 17.5* 19.6*  HGB 14.3 13.7 14.1  HCT 45.7 43.6  44.2  MCV 99.1 98.9 97.6  PLT 226 217 190   Basic Metabolic Panel: Recent Labs  Lab 11/18/23 1417 11/18/23 1920 11/19/23 1012  NA 140  --  143  K 3.9  --  3.8  CL 106  --  107  CO2 23  --  23  GLUCOSE 174*  --  109*  BUN 8  --  10  CREATININE 0.69 0.62 0.73  CALCIUM 9.1  --  8.7*  MG  --   --  2.0  PHOS  --   --  2.7    Studies: MR ABDOMEN MRCP W WO CONTAST Result Date: 11/19/2023 CLINICAL DATA:  Cholelithiasis. Follow-up MRCP inpatient with cholelithiasis. EXAM: MRI ABDOMEN WITHOUT AND WITH CONTRAST (INCLUDING MRCP) TECHNIQUE: Multiplanar multisequence MR imaging of the abdomen was performed both before and after the administration of intravenous contrast. Heavily T2-weighted images of the biliary and pancreatic ducts were obtained, and three-dimensional MRCP images were rendered by post processing. CONTRAST:  7mL GADAVIST  GADOBUTROL  1 MMOL/ML IV SOLN COMPARISON:  Ultrasound abdomen from 11/18/2023. FINDINGS: Lower chest: Unremarkable MR appearance to the lung bases. No pleural effusion. No pericardial effusion. Normal heart size. Hepatobiliary: The liver is normal in size. Noncirrhotic configuration. No focal mass. No intrahepatic or extrahepatic bile duct dilatation. No choledocholithiasis. Physiologically distended gallbladder. Small-to-moderate volume dependent gallstones and sludge. No abnormal wall thickening. There is trace pericholecystic fat stranding and fluid near the neck, which is likely secondary to pancreatitis described below. Pancreas: There is bulky and slightly heterogeneous pancreas exhibiting moderate peripancreatic fat stranding and fluid which extends along the bilateral paracolic gutters. No walled-off abscess. There is normal inhomogeneous enhancement of the entire pancreas, thereby excluding pancreatic necrosis. No suspicious pancreatic lesion. Main pancreatic duct is not dilated. The splenic vein, superior mesenteric vein and portal veins are patent and without  significant stenosis. Spleen:  Within normal limits in size and appearance. No focal mass. Adrenals/Urinary Tract: Unremarkable adrenal glands. No hydroureteronephrosis. No suspicious renal mass. Stomach/Bowel: Visualized portions within the abdomen are unremarkable. No disproportionate dilation of bowel loops. Vascular/Lymphatic: No pathologically enlarged lymph nodes identified. No abdominal aortic aneurysm demonstrated. There is mild ascites. Other: Limited evaluation of uterus and bilateral ovaries on coronal images, which appears grossly within normal limits. Musculoskeletal: No suspicious bone lesions identified. IMPRESSION: 1. Findings compatible with acute pancreatitis. No walled-off abscess or pancreatic necrosis. 2. Cholelithiasis without acute cholecystitis. No intra or extrahepatic bile duct dilation. No choledocholithiasis. 3. Mild ascites. Electronically Signed   By: Ree Molt M.D.   On: 11/19/2023 08:58   US  ABDOMEN LIMITED RUQ (LIVER/GB) Result Date: 11/18/2023 CLINICAL DATA:  RUQ pain, elevated LFTs EXAM: ULTRASOUND ABDOMEN LIMITED RIGHT UPPER QUADRANT COMPARISON:  04/20/2018 FINDINGS: Gallbladder: Small volume biliary sludge with a few small gallstones. No wall thickening or pericholecystic fluid. No sonographic Murphy's sign noted by sonographer. Common bile duct: Diameter: 4 mm Liver: Normal echogenicity. No focal lesion identified. No intrahepatic biliary ductal dilation. Portal vein is patent on color Doppler imaging with normal direction of blood flow towards the liver. Other: None. IMPRESSION: Cholecystolithiasis with small volume biliary sludge. No changes of acute cholecystitis. Electronically Signed   By: Rogelia Myers M.D.   On: 11/18/2023 17:25  Scheduled Meds:  enoxaparin  (LOVENOX ) injection  40 mg Subcutaneous Q24H   [START ON 11/20/2023] influenza vac split trivalent PF  0.5 mL Intramuscular Tomorrow-1000   pantoprazole  (PROTONIX ) IV  40 mg Intravenous Q24H    sertraline   50 mg Oral Daily   Continuous Infusions:  sodium chloride  100 mL/hr at 11/19/23 1142   piperacillin -tazobactam (ZOSYN )  IV 3.375 g (11/19/23 1014)   PRN Meds: acetaminophen , docusate sodium , HYDROmorphone  (DILAUDID ) injection, ondansetron  (ZOFRAN ) IV, oxyCODONE   Time spent: 42 minutes  Author: ELVAN SOR. MD [facility] 11/19/2023 2:17 PM  To reach On-call, see care teams to locate the attending and reach out to them via [email or web address]. If 7PM-7AM, please contact night-coverage If you still have difficulty reaching the attending provider, please page the [facility] (Director on Call) for [facility] on amion for assistance.

## 2023-11-19 NOTE — Consult Note (Signed)
 Consultation  Referring Provider:  Hospitalist    Admit date: 11/18/2023 Consult date        11/19/2023 Reason for Consultation:  Abnormal liver enzymes            HPI:   Teresa Rasmussen is a 18 y.o. lady with supposed intellectual disability who presented with abdominal pain, elevated lft's, and evidence for pancreatitis. Patient states her symptoms have improved some but still with abdominal pain that responds to opioids. MRCP was negative for choledocholithiasis.   History reviewed. No pertinent past medical history.  History reviewed. No pertinent surgical history.  History reviewed. No pertinent family history.   Social History   Tobacco Use   Smoking status: Never   Smokeless tobacco: Never  Substance Use Topics   Alcohol use: No    Prior to Admission medications   Medication Sig Start Date End Date Taking? Authorizing Provider  MIRALAX 17 GM/SCOOP powder Take 17 g by mouth daily. 11/05/23  Yes [provider]  omeprazole (PRILOSEC) 20 MG capsule Take 20 mg by mouth 2 (two) times daily. 08/07/23  Yes [provider]  sertraline  (ZOLOFT ) 50 MG tablet Take 50 mg by mouth daily. 11/05/23  Yes [provider]    Current Facility-Administered Medications  Medication Dose Route Frequency Provider Last Rate Last Admin   0.9 %  sodium chloride  infusion   Intravenous Continuous Von Bellis, MD 100 mL/hr at 11/19/23 1142 New Bag at 11/19/23 1142   acetaminophen  (TYLENOL ) tablet 650 mg  650 mg Oral Q6H PRN Stephens, Tiona K, MD       docusate sodium  (COLACE) capsule 100 mg  100 mg Oral Daily PRN Stephens, Tiona K, MD       enoxaparin  (LOVENOX ) injection 40 mg  40 mg Subcutaneous Q24H Stephens, Tiona K, MD   40 mg at 11/18/23 2119   HYDROmorphone  (DILAUDID ) injection 0.5 mg  0.5 mg Intravenous Q4H PRN Von Bellis, MD       NOREEN ON 11/20/2023] influenza vac split trivalent PF (FLUZONE ) injection 0.5 mL  0.5 mL Intramuscular Tomorrow-1000 Stephens, Tiona  K, MD       ondansetron  (ZOFRAN ) injection 4 mg  4 mg Intravenous Q6H PRN Stephens, Tiona K, MD   4 mg at 11/19/23 0154   oxyCODONE  (Oxy IR/ROXICODONE ) immediate release tablet 5 mg  5 mg Oral Q6H PRN Von Bellis, MD   5 mg at 11/19/23 1311   pantoprazole  (PROTONIX ) injection 40 mg  40 mg Intravenous Q24H Von Bellis, MD   40 mg at 11/19/23 1011   piperacillin -tazobactam (ZOSYN ) IVPB 3.375 g  3.375 g Intravenous Q8H Stephens, Tiona K, MD 12.5 mL/hr at 11/19/23 1754 3.375 g at 11/19/23 1754   sertraline  (ZOLOFT ) tablet 50 mg  50 mg Oral Daily Von Bellis, MD   50 mg at 11/19/23 1031    Allergies as of 11/18/2023   (No Known Allergies)     Review of Systems:    All systems reviewed and negative except where noted in HPI.  Review of Systems  Constitutional:  Negative for chills and fever.  Cardiovascular:  Negative for leg swelling.  Gastrointestinal:  Positive for abdominal pain. Negative for blood in stool.  Skin:  Negative for rash.  Neurological:  Negative for focal weakness.  Psychiatric/Behavioral:  Negative for substance abuse.   All other systems reviewed and are negative.     Physical Exam:  Vital signs in last 24 hours: Temp:  [97.5 F (36.4 C)-99.6 F (37.6 C)] 97.5  F (36.4 C) (09/24 1719) Pulse Rate:  [62-105] 99 (09/24 1719) Resp:  [15-20] 18 (09/24 1719) BP: (109-139)/(75-92) 120/85 (09/24 1719) SpO2:  [97 %-100 %] 97 % (09/24 1719) Weight:  [78.4 kg] 78.4 kg (09/23 1840)   General:   Pleasant in NAD Head:  Normocephalic and atraumatic. Eyes:   No icterus.   Conjunctiva pink. Ears:  Normal auditory acuity. Mouth: Mucosa pink moist, no lesions. Neck:  Supple; no masses felt Lungs:  No respiratory distress Abdomen:   Flat, soft, nondistended, nontender Msk:  No clubbing or cyanosis. Strength 5/5. Symmetrical without gross deformities. Neurologic:  Alert and  oriented x4;  No focal deficits Skin:  Warm, dry, pink without significant lesions or  rashes. Psych:  Alert and cooperative. Normal affect.  LAB RESULTS: Recent Labs    11/18/23 1417 11/18/23 1920 11/19/23 1012  WBC 16.0* 17.5* 19.6*  HGB 14.3 13.7 14.1  HCT 45.7 43.6 44.2  PLT 226 217 190   BMET Recent Labs    11/18/23 1417 11/18/23 1920 11/19/23 1012  NA 140  --  143  K 3.9  --  3.8  CL 106  --  107  CO2 23  --  23  GLUCOSE 174*  --  109*  BUN 8  --  10  CREATININE 0.69 0.62 0.73  CALCIUM 9.1  --  8.7*   LFT Recent Labs    11/19/23 1012  PROT 8.0  ALBUMIN 4.0  AST 74*  ALT 179*  ALKPHOS 134*  BILITOT 2.0*  BILIDIR 0.6*  IBILI 1.4*   PT/INR Recent Labs    11/18/23 1641  LABPROT 14.4  INR 1.1    STUDIES: MR ABDOMEN MRCP W WO CONTAST Result Date: 11/19/2023 CLINICAL DATA:  Cholelithiasis. Follow-up MRCP inpatient with cholelithiasis. EXAM: MRI ABDOMEN WITHOUT AND WITH CONTRAST (INCLUDING MRCP) TECHNIQUE: Multiplanar multisequence MR imaging of the abdomen was performed both before and after the administration of intravenous contrast. Heavily T2-weighted images of the biliary and pancreatic ducts were obtained, and three-dimensional MRCP images were rendered by post processing. CONTRAST:  7mL GADAVIST  GADOBUTROL  1 MMOL/ML IV SOLN COMPARISON:  Ultrasound abdomen from 11/18/2023. FINDINGS: Lower chest: Unremarkable MR appearance to the lung bases. No pleural effusion. No pericardial effusion. Normal heart size. Hepatobiliary: The liver is normal in size. Noncirrhotic configuration. No focal mass. No intrahepatic or extrahepatic bile duct dilatation. No choledocholithiasis. Physiologically distended gallbladder. Small-to-moderate volume dependent gallstones and sludge. No abnormal wall thickening. There is trace pericholecystic fat stranding and fluid near the neck, which is likely secondary to pancreatitis described below. Pancreas: There is bulky and slightly heterogeneous pancreas exhibiting moderate peripancreatic fat stranding and fluid which  extends along the bilateral paracolic gutters. No walled-off abscess. There is normal inhomogeneous enhancement of the entire pancreas, thereby excluding pancreatic necrosis. No suspicious pancreatic lesion. Main pancreatic duct is not dilated. The splenic vein, superior mesenteric vein and portal veins are patent and without significant stenosis. Spleen:  Within normal limits in size and appearance. No focal mass. Adrenals/Urinary Tract: Unremarkable adrenal glands. No hydroureteronephrosis. No suspicious renal mass. Stomach/Bowel: Visualized portions within the abdomen are unremarkable. No disproportionate dilation of bowel loops. Vascular/Lymphatic: No pathologically enlarged lymph nodes identified. No abdominal aortic aneurysm demonstrated. There is mild ascites. Other: Limited evaluation of uterus and bilateral ovaries on coronal images, which appears grossly within normal limits. Musculoskeletal: No suspicious bone lesions identified. IMPRESSION: 1. Findings compatible with acute pancreatitis. No walled-off abscess or pancreatic necrosis. 2. Cholelithiasis without acute cholecystitis. No  intra or extrahepatic bile duct dilation. No choledocholithiasis. 3. Mild ascites. Electronically Signed   By: Ree Molt M.D.   On: 11/19/2023 08:58   US  ABDOMEN LIMITED RUQ (LIVER/GB) Result Date: 11/18/2023 CLINICAL DATA:  RUQ pain, elevated LFTs EXAM: ULTRASOUND ABDOMEN LIMITED RIGHT UPPER QUADRANT COMPARISON:  04/20/2018 FINDINGS: Gallbladder: Small volume biliary sludge with a few small gallstones. No wall thickening or pericholecystic fluid. No sonographic Murphy's sign noted by sonographer. Common bile duct: Diameter: 4 mm Liver: Normal echogenicity. No focal lesion identified. No intrahepatic biliary ductal dilation. Portal vein is patent on color Doppler imaging with normal direction of blood flow towards the liver. Other: None. IMPRESSION: Cholecystolithiasis with small volume biliary sludge. No changes of  acute cholecystitis. Electronically Signed   By: Rogelia Myers M.D.   On: 11/18/2023 17:25       Impression / Plan:   18 y/o lady who presents with gallstone pancreatitis. It appears the stone has passed based on MRCP  - recommend pain control - IV fluids - advance diet as tolerated - recommend surgery consult for consideration of cholecystectomy - daily CMP  Will likely follow peripherally, please call with any questions or concerns.  Frederic Schick MD, MPH [facility]

## 2023-11-19 NOTE — Plan of Care (Signed)

## 2023-11-20 DIAGNOSIS — K8020 Calculus of gallbladder without cholecystitis without obstruction: Secondary | ICD-10-CM | POA: Diagnosis not present

## 2023-11-20 DIAGNOSIS — K851 Biliary acute pancreatitis without necrosis or infection: Secondary | ICD-10-CM | POA: Diagnosis not present

## 2023-11-20 DIAGNOSIS — R7989 Other specified abnormal findings of blood chemistry: Secondary | ICD-10-CM

## 2023-11-20 LAB — LIPASE, BLOOD: Lipase: 684 U/L — ABNORMAL HIGH (ref 11–51)

## 2023-11-20 LAB — CBC
HCT: 39.9 % (ref 36.0–46.0)
Hemoglobin: 12.5 g/dL (ref 12.0–15.0)
MCH: 31.1 pg (ref 26.0–34.0)
MCHC: 31.3 g/dL (ref 30.0–36.0)
MCV: 99.3 fL (ref 80.0–100.0)
Platelets: 182 K/uL (ref 150–400)
RBC: 4.02 MIL/uL (ref 3.87–5.11)
RDW: 14.1 % (ref 11.5–15.5)
WBC: 15.8 K/uL — ABNORMAL HIGH (ref 4.0–10.5)
nRBC: 0 % (ref 0.0–0.2)

## 2023-11-20 LAB — BASIC METABOLIC PANEL WITH GFR
Anion gap: 10 (ref 5–15)
BUN: 8 mg/dL (ref 6–20)
CO2: 26 mmol/L (ref 22–32)
Calcium: 8.1 mg/dL — ABNORMAL LOW (ref 8.9–10.3)
Chloride: 102 mmol/L (ref 98–111)
Creatinine, Ser: 0.88 mg/dL (ref 0.44–1.00)
GFR, Estimated: >60 mL/min (ref >60)
Glucose, Bld: 120 mg/dL — ABNORMAL HIGH (ref 70–99)
Potassium: 3.6 mmol/L (ref 3.5–5.1)
Sodium: 138 mmol/L (ref 135–145)

## 2023-11-20 LAB — PHOSPHORUS: Phosphorus: 2.4 mg/dL — ABNORMAL LOW (ref 2.5–4.6)

## 2023-11-20 LAB — HEPATIC FUNCTION PANEL
ALT: 124 U/L — ABNORMAL HIGH (ref 0–44)
AST: 48 U/L — ABNORMAL HIGH (ref 15–41)
Albumin: 3.5 g/dL (ref 3.5–5.0)
Alkaline Phosphatase: 129 U/L — ABNORMAL HIGH (ref 38–126)
Bilirubin, Direct: 0.6 mg/dL — ABNORMAL HIGH (ref 0.0–0.2)
Indirect Bilirubin: 0.9 mg/dL (ref 0.3–0.9)
Total Bilirubin: 1.5 mg/dL — ABNORMAL HIGH (ref 0.0–1.2)
Total Protein: 7.2 g/dL (ref 6.5–8.1)

## 2023-11-20 LAB — MAGNESIUM: Magnesium: 2.2 mg/dL (ref 1.7–2.4)

## 2023-11-20 LAB — LACTIC ACID, PLASMA
Lactic Acid, Venous: 1.8 mmol/L (ref 0.5–1.9)
Lactic Acid, Venous: 1.2 mmol/L (ref 0.5–1.9)

## 2023-11-20 MED ORDER — SODIUM CHLORIDE 0.9 % IV BOLUS
1000.0000 mL | Freq: Once | INTRAVENOUS | Status: AC
  Administered 2023-11-20: 1000 mL via INTRAVENOUS

## 2023-11-20 MED ORDER — POTASSIUM PHOSPHATES 15 MMOLE/5ML IV SOLN
30.0000 mmol | Freq: Once | INTRAVENOUS | Status: AC
  Administered 2023-11-20: 30 mmol via INTRAVENOUS
  Filled 2023-11-20: qty 10

## 2023-11-20 MED ORDER — VITAMIN B-12 1000 MCG PO TABS
1000.0000 ug | ORAL_TABLET | Freq: Every day | ORAL | Status: DC
Start: 2023-11-20 — End: 2023-11-25
  Administered 2023-11-20 – 2023-11-25 (×6): 1000 ug via ORAL
  Filled 2023-11-20 (×7): qty 1

## 2023-11-20 NOTE — Plan of Care (Signed)
   Problem: Education: Goal: Knowledge of General Education information will improve Description: Including pain rating scale, medication(s)/side effects and non-pharmacologic comfort measures Outcome: Progressing   Problem: Activity: Goal: Risk for activity intolerance will decrease Outcome: Progressing   Problem: Nutrition: Goal: Adequate nutrition will be maintained Outcome: Progressing   Problem: Coping: Goal: Level of anxiety will decrease Outcome: Progressing

## 2023-11-20 NOTE — Progress Notes (Signed)
 [facility] Progress Note  Patient: Teresa Rasmussen    FMW:[id number]  DOA: 11/18/2023     Date of Service: the patient was seen and examined on 11/20/2023  Chief Complaint  Patient presents with   Abdominal Pain   Brief hospital course: This is 18 years old female with intellectual disability, Nexplanon in place presented in the [facility] ED with complaining of abdominal pain.  ED workup: VS stable, afebrile BMP: BG 124 elevated Lipase >2800 Alk phos 149, AST 181, ALT 244 elevated.  TP 5.3, direct bili 3.0 CBC: Leukocytosis WBC 17.5 Hepatitis panel negative, EtOH <15, UA negative US  Abd: Cholecystolithiasis with small volume biliary sludge. No changes of acute cholecystitis. MRCP: 1. Findings compatible with acute pancreatitis. No walled-off abscess or pancreatic necrosis. 2. Cholelithiasis without acute cholecystitis. No intra or extrahepatic bile duct dilation. No choledocholithiasis. 3. Mild ascites.   Assessment and Plan:  # Cholelithiasis Continue as needed medication for pain control Continue IV fluid General Surgery consulted 9/25 afebrile, blood cultures ordered, continue empiric antibiotics.   # Acute pancreatitis GI signed off, MRCP negative for CBD stone Continue prn meds for pain control Continue IV fluid for hydration Trend lipase level [id number] CLD  # Hypophosphatemia, Phos repleted with Check electrolytes daily and replete as needed.  # Tachycardia and hypotension could be due to IV pain medication Lactic acid 1.8 negative IV fluid bolus given Continue IV fluid  # Transaminitis secondary to cholelithiasis # Leukocytosis most likely reactive Procalcitonin negative Continue empiric antibiotics for now Trend LFTs and WBC count  # GERD on PPI  # Obesity class II Body mass index is 36.14 kg/m.  Interventions:Calorie restricted diet and daily exercise advised to lose body weight.  Lifestyle modification discussed.   Diet: CLD DVT  Prophylaxis: Subcutaneous Lovenox    Advance goals of care discussion: Full code  Family Communication: family was present at bedside, at the time of interview.  The pt provided permission to discuss medical plan with the family. Opportunity was given to ask question and all questions were answered satisfactorily.   Disposition:  Pt is from Home, admitted with cholelithiasis, pancreatitis and transaminitis, still has intractable pain and electrolyte imbalance, which precludes a safe discharge. Discharge to home, when stable, may need few days to improve.  Subjective: No significant events overnight, patient still having significant pain and requiring IV pain medications. Patient was also having headache in the morning could be due to hypotension, Denied any other complaints   Physical Exam: General: NAD, lying comfortably Appear in no distress, affect appropriate Eyes: PERRLA ENT: Oral Mucosa Clear, moist  Neck: no JVD,  Cardiovascular: S1 and S2 Present, no Murmur,  Respiratory: good respiratory effort, Bilateral Air entry equal and Decreased, no Crackles, no wheezes Abdomen: BS present, Soft and mild epigastric/ RUQ tenderness,  Skin: no rashes Extremities: no Pedal edema, no calf tenderness Neurologic: without any new focal findings Gait not checked due to patient safety concerns  Vitals:   11/20/23 0558 11/20/23 0632 11/20/23 0838 11/20/23 1155  BP:  (!) 95/58 (!) 94/58 104/64  Pulse:  (!) 107 (!) 116 (!) 125  Resp:  18 17 19   Temp: 99.2 F (37.3 C) 98.2 F (36.8 C) 98.6 F (37 C)   TempSrc: Oral Oral    SpO2:  93% (!) 89% 92%  Weight:      Height:        Intake/Output Summary (Last 24 hours) at 11/20/2023 1557 Last data filed at 11/20/2023 0600 Gross per 24 hour  Intake 580 ml  Output --  Net 580 ml   Filed Weights   11/18/23 1840  Weight: 78.4 kg    Data Reviewed: I have personally reviewed and interpreted daily labs, tele strips, imagings as discussed  above. I reviewed all nursing notes, pharmacy notes, vitals, pertinent old records I have discussed plan of care as described above with RN and patient/family.  CBC: Recent Labs  Lab 11/18/23 1417 11/18/23 1920 11/19/23 1012 11/20/23 0224  WBC 16.0* 17.5* 19.6* 15.8*  HGB 14.3 13.7 14.1 12.5  HCT 45.7 43.6 44.2 39.9  MCV 99.1 98.9 97.6 99.3  PLT 226 217 190 182   Basic Metabolic Panel: Recent Labs  Lab 11/18/23 1417 11/18/23 1920 11/19/23 1012 11/20/23 0224  NA 140  --  143 138  K 3.9  --  3.8 3.6  CL 106  --  107 102  CO2 23  --  23 26  GLUCOSE 174*  --  109* 120*  BUN 8  --  10 8  CREATININE 0.69 0.62 0.73 0.88  CALCIUM 9.1  --  8.7* 8.1*  MG  --   --  2.0 2.2  PHOS  --   --  2.7 2.4*    Studies: No results found.   Scheduled Meds:  vitamin B-12  1,000 mcg Oral Daily   enoxaparin  (LOVENOX ) injection  40 mg Subcutaneous Q24H   influenza vac split trivalent PF  0.5 mL Intramuscular Tomorrow-1000   pantoprazole  (PROTONIX ) IV  40 mg Intravenous Q24H   sertraline   50 mg Oral Daily   Continuous Infusions:  sodium chloride  1,000 mL (11/20/23 1427)   piperacillin -tazobactam (ZOSYN )  IV 3.375 g (11/20/23 1022)   PRN Meds: acetaminophen , docusate sodium , HYDROmorphone  (DILAUDID ) injection, ondansetron  (ZOFRAN ) IV, oxyCODONE   Time spent: 55 minutes  Author: ELVAN SOR. MD [facility] 11/20/2023 3:57 PM  To reach On-call, see care teams to locate the attending and reach out to them via [email or web address]. If 7PM-7AM, please contact night-coverage If you still have difficulty reaching the attending provider, please page the [facility] (Director on Call) for [facility] on amion for assistance.

## 2023-11-20 NOTE — Progress Notes (Signed)
 Teresa Copping, MD [facility]   [address], [address] [place], [place] [address] Phone: [phone] Fax : [phone]   Subjective: The patient is resting comfortably.  The patient is taking less pain medication today.  Her pancreatic enzyme pancreatic enzymes have come down.  Her liver functions have also decreased with her bilirubin down from 5.3 down to 1.5.  The patient is being seen by surgery for a cholecystectomy.  The patient's MRCP did not show any stones in the common bile duct.   Objective: Vital signs in last 24 hours: Vitals:   11/20/23 0558 11/20/23 0632 11/20/23 0838 11/20/23 1155  BP:  (!) 95/58 (!) 94/58 104/64  Pulse:  (!) 107 (!) 116 (!) 125  Resp:  18 17 19   Temp: 99.2 F (37.3 C) 98.2 F (36.8 C) 98.6 F (37 C)   TempSrc: Oral Oral    SpO2:  93% (!) 89% 92%  Weight:      Height:       Weight change:   Intake/Output Summary (Last 24 hours) at 11/20/2023 1413 Last data filed at 11/20/2023 0600 Gross per 24 hour  Intake 580 ml  Output --  Net 580 ml     Exam: General: Patient laying in bed in no apparent distress sleepy after having pain medication.   Lab Results: @LABTEST2 @ Micro Results: No results found for this or any previous visit (from the past 240 hours). Studies/Results: MR 3D Recon At Scanner Result Date: 11/19/2023 CLINICAL DATA:  Cholelithiasis. Follow-up MRCP inpatient with cholelithiasis. EXAM: MRI ABDOMEN WITHOUT AND WITH CONTRAST (INCLUDING MRCP) TECHNIQUE: Multiplanar multisequence MR imaging of the abdomen was performed both before and after the administration of intravenous contrast. Heavily T2-weighted images of the biliary and pancreatic ducts were obtained, and three-dimensional MRCP images were rendered by post processing. CONTRAST:  7mL GADAVIST  GADOBUTROL  1 MMOL/ML IV SOLN COMPARISON:  Ultrasound abdomen from 11/18/2023. FINDINGS: Lower chest: Unremarkable MR appearance to the lung bases. No pleural effusion. No pericardial  effusion. Normal heart size. Hepatobiliary: The liver is normal in size. Noncirrhotic configuration. No focal mass. No intrahepatic or extrahepatic bile duct dilatation. No choledocholithiasis. Physiologically distended gallbladder. Small-to-moderate volume dependent gallstones and sludge. No abnormal wall thickening. There is trace pericholecystic fat stranding and fluid near the neck, which is likely secondary to pancreatitis described below. Pancreas: There is bulky and slightly heterogeneous pancreas exhibiting moderate peripancreatic fat stranding and fluid which extends along the bilateral paracolic gutters. No walled-off abscess. There is normal inhomogeneous enhancement of the entire pancreas, thereby excluding pancreatic necrosis. No suspicious pancreatic lesion. Main pancreatic duct is not dilated. The splenic vein, superior mesenteric vein and portal veins are patent and without significant stenosis. Spleen:  Within normal limits in size and appearance. No focal mass. Adrenals/Urinary Tract: Unremarkable adrenal glands. No hydroureteronephrosis. No suspicious renal mass. Stomach/Bowel: Visualized portions within the abdomen are unremarkable. No disproportionate dilation of bowel loops. Vascular/Lymphatic: No pathologically enlarged lymph nodes identified. No abdominal aortic aneurysm demonstrated. There is mild ascites. Other: Limited evaluation of uterus and bilateral ovaries on coronal images, which appears grossly within normal limits. Musculoskeletal: No suspicious bone lesions identified. IMPRESSION: 1. Findings compatible with acute pancreatitis. No walled-off abscess or pancreatic necrosis. 2. Cholelithiasis without acute cholecystitis. No intra or extrahepatic bile duct dilation. No choledocholithiasis. 3. Mild ascites. Electronically Signed   By: Ree Molt M.D.   On: 11/19/2023 11:20   MR ABDOMEN MRCP W WO CONTAST Result Date: 11/19/2023 CLINICAL DATA:  Cholelithiasis. Follow-up MRCP  inpatient with cholelithiasis. EXAM: MRI ABDOMEN WITHOUT AND WITH CONTRAST (INCLUDING MRCP) TECHNIQUE: Multiplanar multisequence MR imaging of the abdomen was performed both before and after the administration of intravenous contrast. Heavily T2-weighted images of the biliary and pancreatic ducts were obtained, and three-dimensional MRCP images were rendered by post processing. CONTRAST:  7mL GADAVIST  GADOBUTROL  1 MMOL/ML IV SOLN COMPARISON:  Ultrasound abdomen from 11/18/2023. FINDINGS: Lower chest: Unremarkable MR appearance to the lung bases. No pleural effusion. No pericardial effusion. Normal heart size. Hepatobiliary: The liver is normal in size. Noncirrhotic configuration. No focal mass. No intrahepatic or extrahepatic bile duct dilatation. No choledocholithiasis. Physiologically distended gallbladder. Small-to-moderate volume dependent gallstones and sludge. No abnormal wall thickening. There is trace pericholecystic fat stranding and fluid near the neck, which is likely secondary to pancreatitis described below. Pancreas: There is bulky and slightly heterogeneous pancreas exhibiting moderate peripancreatic fat stranding and fluid which extends along the bilateral paracolic gutters. No walled-off abscess. There is normal inhomogeneous enhancement of the entire pancreas, thereby excluding pancreatic necrosis. No suspicious pancreatic lesion. Main pancreatic duct is not dilated. The splenic vein, superior mesenteric vein and portal veins are patent and without significant stenosis. Spleen:  Within normal limits in size and appearance. No focal mass. Adrenals/Urinary Tract: Unremarkable adrenal glands. No hydroureteronephrosis. No suspicious renal mass. Stomach/Bowel: Visualized portions within the abdomen are unremarkable. No disproportionate dilation of bowel loops. Vascular/Lymphatic: No pathologically enlarged lymph nodes identified. No abdominal aortic aneurysm demonstrated. There is mild ascites. Other:  Limited evaluation of uterus and bilateral ovaries on coronal images, which appears grossly within normal limits. Musculoskeletal: No suspicious bone lesions identified. IMPRESSION: 1. Findings compatible with acute pancreatitis. No walled-off abscess or pancreatic necrosis. 2. Cholelithiasis without acute cholecystitis. No intra or extrahepatic bile duct dilation. No choledocholithiasis. 3. Mild ascites. Electronically Signed   By: Ree Molt M.D.   On: 11/19/2023 08:58   US  ABDOMEN LIMITED RUQ (LIVER/GB) Result Date: 11/18/2023 CLINICAL DATA:  RUQ pain, elevated LFTs EXAM: ULTRASOUND ABDOMEN LIMITED RIGHT UPPER QUADRANT COMPARISON:  04/20/2018 FINDINGS: Gallbladder: Small volume biliary sludge with a few small gallstones. No wall thickening or pericholecystic fluid. No sonographic Murphy's sign noted by sonographer. Common bile duct: Diameter: 4 mm Liver: Normal echogenicity. No focal lesion identified. No intrahepatic biliary ductal dilation. Portal vein is patent on color Doppler imaging with normal direction of blood flow towards the liver. Other: None. IMPRESSION: Cholecystolithiasis with small volume biliary sludge. No changes of acute cholecystitis. Electronically Signed   By: Teresa Myers M.D.   On: 11/18/2023 17:25   Medications: I have reviewed the patient's current medications. Scheduled Meds:  vitamin B-12  1,000 mcg Oral Daily   enoxaparin  (LOVENOX ) injection  40 mg Subcutaneous Q24H   influenza vac split trivalent PF  0.5 mL Intramuscular Tomorrow-1000   pantoprazole  (PROTONIX ) IV  40 mg Intravenous Q24H   sertraline   50 mg Oral Daily   Continuous Infusions:  sodium chloride  100 mL/hr at 11/20/23 9295   piperacillin -tazobactam (ZOSYN )  IV 3.375 g (11/20/23 1022)   potassium PHOSPHATE  IVPB (in mmol) 30 mmol (11/20/23 0942)   PRN Meds:.acetaminophen , docusate sodium , HYDROmorphone  (DILAUDID ) injection, ondansetron  (ZOFRAN ) IV, oxyCODONE    Assessment: Principal Problem:    Cholelithiasis Active Problems:   Acute biliary pancreatitis without infection or necrosis    Plan: The patient had gallstone pancreatitis with the MRCP showing no stones in the common bile duct.  The patient is being followed by surgery for a cholecystectomy and to decide when the best timing  for that would be.  Since the patient has no signs of complications from the pancreatitis such as a phlegmon abscess or necrosis there is nothing further to do from a GI point of view and I do recommend proceeding with a cholecystectomy when surgery deems it appropriate.  I will sign off.  Please call if any further GI concerns or questions.  We would like to thank you for the opportunity to participate in the care of [facility].    LOS: 2 days   Teresa Copping, MD.FACG 11/20/2023, 2:13 PM Pager [phone] 7am-5pm  Check AMION for 5pm -7am coverage and on weekends

## 2023-11-20 NOTE — Progress Notes (Addendum)
 [facility] SURGICAL PROGRESS NOTE (cpt [phone])  Hospital Day(s): 2.   Interval History: Patient seen and examined, no acute events or new complaints overnight. She has been febrile in th last 24 hours. T-Max 100.36F at 0400. This morning she feels dizzy and has been diaphoretic. She does report he abdominal pain has improved compared to prior days. Some nausea but without emesis. Her WBC is improving; WBC 15.8K (from 19.6K yesterday), Hgb to 12.5, sCr normal at 0.88, bilirubin improving now 1.5, lipase improving as well 684. She does have blood Cx and venous lactic levels pending draw. She continues on Zosyn . She has been on CLD; no issues.   Review of Systems:  Constitutional: + fever, + diaphoresis  HEENT: denies cough or congestion  Respiratory: denies any shortness of breath  Cardiovascular: denies chest pain or palpitations  Gastrointestinal: + abdominal pain (improving), + nausea, denied emesis Genitourinary: denies burning with urination or urinary frequency Musculoskeletal: denies pain, decreased motor or sensation  Vital signs in last 24 hours: [min-max] current  Temp:  [97.5 F (36.4 C)-100.9 F (38.3 C)] 98.2 F (36.8 C) (09/25 9367) Pulse Rate:  [99-112] 107 (09/25 0632) Resp:  [16-18] 18 (09/25 9367) BP: (95-128)/(58-85) 95/58 (09/25 9367) SpO2:  [93 %-99 %] 93 % (09/25 9367)     Height: 4' 10 (147.3 cm) Weight: 78.4 kg     Intake/Output last 2 shifts:  09/24 0701 - 09/25 0700 In: 580 [P.O.:480; IV Piggyback:100] Out: -    Physical Exam:  Constitutional: alert, cooperative and no distress  HENT: normocephalic without obvious abnormality  Eyes: PERRL, EOM's grossly intact and symmetric  Respiratory: breathing non-labored at rest  Cardiovascular: regular rate and sinus rhythm  Gastrointestinal: soft, epigastric tenderness improving, and non-distended. She is not peritonitic    Labs:     Latest Ref Rng & Units 11/20/2023    2:24 AM 11/19/2023    10:12 AM 11/18/2023    7:20 PM  CBC  WBC 4.0 - 10.5 K/uL 15.8  19.6  17.5   Hemoglobin 12.0 - 15.0 g/dL 87.4  85.8  86.2   Hematocrit 36.0 - 46.0 % 39.9  44.2  43.6   Platelets 150 - 400 K/uL 182  190  217       Latest Ref Rng & Units 11/20/2023    2:24 AM 11/19/2023   10:12 AM 11/18/2023    7:20 PM  CMP  Glucose 70 - 99 mg/dL 879  890    BUN 6 - 20 mg/dL 8  10    Creatinine 9.55 - 1.00 mg/dL 9.11  9.26  9.37   Sodium 135 - 145 mmol/L 138  143    Potassium 3.5 - 5.1 mmol/L 3.6  3.8    Chloride 98 - 111 mmol/L 102  107    CO2 22 - 32 mmol/L 26  23    Calcium 8.9 - 10.3 mg/dL 8.1  8.7    Total Protein 6.5 - 8.1 g/dL 7.2  8.0    Total Bilirubin 0.0 - 1.2 mg/dL 1.5  2.0    Alkaline Phos 38 - 126 U/L 129  134    AST 15 - 41 U/L 48  74    ALT 0 - 44 U/L 124  179      Imaging studies: No new pertinent imaging studies   Assessment/Plan:  18 y.o. female with improving leukocytosis and hyperbilirubinemia admitted with gallstone pancreatitis without choledocholithiasis on MRCP   - Given her continued pain as  well as fever overnight, I do think it may be best to allow another 24 hours for treatment and resuscitation prior to proceeding with cholecystectomy. Anticipate we could proceed tomorrow (09/26). I will tentatively post for tomorrow to hold a spot. We will reassess in the AM for readiness as well.   - I will add bolus of NS this AM  - Follow up venous lactate and BCx  - I think it is reasonable to continue CLD for now; Will make NPO at midnight  - Continue IV Abx - Monitor abdominal examination; on-going bowel function - Monitor leukocytosis; improving - Monitor lipase; improving - Monitor hyperbilirubinemia; improving - Pain control prn; antiemetics prn   - Mobilize - Further management per primary service; we will follow along   All of the above findings and recommendations were discussed with the patient, and the medical team, and all of patient's questions were answered to  her expressed satisfaction.  -- Arthea Platt, PA-C [facility] 11/20/2023, 8:15 AM M-F: 7am - 4pm

## 2023-11-21 ENCOUNTER — Encounter
Admission: EM | Disposition: A | Discharge status: Home / Self Care | Payer: Self-pay | Source: Home / Self Care | Attending: Student

## 2023-11-21 ENCOUNTER — Inpatient Hospital Stay: Payer: MEDICAID

## 2023-11-21 ENCOUNTER — Other Ambulatory Visit: Payer: Self-pay

## 2023-11-21 DIAGNOSIS — K8018 Calculus of gallbladder with other cholecystitis without obstruction: Secondary | ICD-10-CM

## 2023-11-21 DIAGNOSIS — K8020 Calculus of gallbladder without cholecystitis without obstruction: Secondary | ICD-10-CM | POA: Diagnosis not present

## 2023-11-21 DIAGNOSIS — K851 Biliary acute pancreatitis without necrosis or infection: Secondary | ICD-10-CM | POA: Diagnosis not present

## 2023-11-21 LAB — LIPASE, BLOOD: Lipase: 93 U/L — ABNORMAL HIGH (ref 11–51)

## 2023-11-21 LAB — CBC
HCT: 34.7 % — ABNORMAL LOW (ref 36.0–46.0)
Hemoglobin: 11.1 g/dL — ABNORMAL LOW (ref 12.0–15.0)
MCH: 31.3 pg (ref 26.0–34.0)
MCHC: 32 g/dL (ref 30.0–36.0)
MCV: 97.7 fL (ref 80.0–100.0)
Platelets: 162 K/uL (ref 150–400)
RBC: 3.55 MIL/uL — ABNORMAL LOW (ref 3.87–5.11)
RDW: 13.9 % (ref 11.5–15.5)
WBC: 18.8 K/uL — ABNORMAL HIGH (ref 4.0–10.5)
nRBC: 0 % (ref 0.0–0.2)

## 2023-11-21 LAB — VITAMIN D 25 HYDROXY (VIT D DEFICIENCY, FRACTURES): Vit D, 25-Hydroxy: 24.01 ng/mL — ABNORMAL LOW (ref 30–100)

## 2023-11-21 LAB — BASIC METABOLIC PANEL WITH GFR
Anion gap: 9 (ref 5–15)
BUN: 5 mg/dL — ABNORMAL LOW (ref 6–20)
CO2: 24 mmol/L (ref 22–32)
Calcium: 7.6 mg/dL — ABNORMAL LOW (ref 8.9–10.3)
Chloride: 106 mmol/L (ref 98–111)
Creatinine, Ser: 0.54 mg/dL (ref 0.44–1.00)
GFR, Estimated: >60 mL/min (ref >60)
Glucose, Bld: 106 mg/dL — ABNORMAL HIGH (ref 70–99)
Potassium: 3.6 mmol/L (ref 3.5–5.1)
Sodium: 139 mmol/L (ref 135–145)

## 2023-11-21 LAB — IRON AND TIBC
Iron: 25 ug/dL — ABNORMAL LOW (ref 28–170)
Saturation Ratios: 9 % — ABNORMAL LOW (ref 10.4–31.8)
TIBC: 281 ug/dL (ref 250–450)
UIBC: 256 ug/dL

## 2023-11-21 LAB — HEPATIC FUNCTION PANEL
ALT: 77 U/L — ABNORMAL HIGH (ref 0–44)
AST: 23 U/L (ref 15–41)
Albumin: 2.9 g/dL — ABNORMAL LOW (ref 3.5–5.0)
Alkaline Phosphatase: 98 U/L (ref 38–126)
Bilirubin, Direct: 0.4 mg/dL — ABNORMAL HIGH (ref 0.0–0.2)
Indirect Bilirubin: 1 mg/dL — ABNORMAL HIGH (ref 0.3–0.9)
Total Bilirubin: 1.4 mg/dL — ABNORMAL HIGH (ref 0.0–1.2)
Total Protein: 6.4 g/dL — ABNORMAL LOW (ref 6.5–8.1)

## 2023-11-21 LAB — MAGNESIUM: Magnesium: 1.8 mg/dL (ref 1.7–2.4)

## 2023-11-21 LAB — PHOSPHORUS: Phosphorus: 2.3 mg/dL — ABNORMAL LOW (ref 2.5–4.6)

## 2023-11-21 SURGERY — CHOLECYSTECTOMY, ROBOT-ASSISTED, LAPAROSCOPIC
Anesthesia: General

## 2023-11-21 MED ORDER — ALBUTEROL SULFATE HFA 108 (90 BASE) MCG/ACT IN AERS
INHALATION_SPRAY | RESPIRATORY_TRACT | Status: DC | PRN
Start: 2023-11-21 — End: 2023-11-21
  Administered 2023-11-21: 6 via RESPIRATORY_TRACT

## 2023-11-21 MED ORDER — ROCURONIUM BROMIDE 10 MG/ML (PF) SYRINGE
PREFILLED_SYRINGE | INTRAVENOUS | Status: AC
  Filled 2023-11-21: qty 10

## 2023-11-21 MED ORDER — HYDRALAZINE HCL 20 MG/ML IJ SOLN
INTRAMUSCULAR | Status: AC
  Filled 2023-11-21: qty 1

## 2023-11-21 MED ORDER — CHLORHEXIDINE GLUCONATE 0.12 % MT SOLN: 15.0000 mL | Freq: Once | OROMUCOSAL | Status: DC

## 2023-11-21 MED ORDER — VITAMIN C 500 MG PO TABS
500.0000 mg | ORAL_TABLET | Freq: Every day | ORAL | Status: DC
  Administered 2023-11-22 – 2023-11-25 (×4): 500 mg via ORAL
  Filled 2023-11-21 (×5): qty 1

## 2023-11-21 MED ORDER — ACETAMINOPHEN 10 MG/ML IV SOLN
INTRAVENOUS | Status: AC
  Filled 2023-11-21: qty 100

## 2023-11-21 MED ORDER — PROPOFOL 1000 MG/100ML IV EMUL
INTRAVENOUS | Status: AC
  Filled 2023-11-21: qty 100

## 2023-11-21 MED ORDER — ROCURONIUM BROMIDE 10 MG/ML (PF) SYRINGE
PREFILLED_SYRINGE | INTRAVENOUS | Status: AC
Start: 2023-11-21 — End: 2023-11-21
  Filled 2023-11-21: qty 10

## 2023-11-21 MED ORDER — PIPERACILLIN-TAZOBACTAM 3.375 G IVPB
INTRAVENOUS | Status: AC
  Filled 2023-11-21: qty 50

## 2023-11-21 MED ORDER — FENTANYL CITRATE (PF) 100 MCG/2ML IJ SOLN
INTRAMUSCULAR | Status: AC
  Filled 2023-11-21: qty 2

## 2023-11-21 MED ORDER — HYDROMORPHONE HCL 1 MG/ML IJ SOLN
INTRAMUSCULAR | Status: AC
  Filled 2023-11-21: qty 1

## 2023-11-21 MED ORDER — ACETAMINOPHEN 10 MG/ML IV SOLN: 1000.0000 mg | Freq: Once | INTRAVENOUS | Status: DC | PRN

## 2023-11-21 MED ORDER — DEXAMETHASONE SODIUM PHOSPHATE 10 MG/ML IJ SOLN
INTRAMUSCULAR | Status: DC | PRN
  Administered 2023-11-21: 10 mg via INTRAVENOUS

## 2023-11-21 MED ORDER — DROPERIDOL 2.5 MG/ML IJ SOLN
INTRAMUSCULAR | Status: AC
  Filled 2023-11-21: qty 2

## 2023-11-21 MED ORDER — DROPERIDOL 2.5 MG/ML IJ SOLN
0.6250 mg | Freq: Once | INTRAMUSCULAR | Status: AC
  Administered 2023-11-21: 0.625 mg via INTRAVENOUS

## 2023-11-21 MED ORDER — MIDAZOLAM HCL 2 MG/2ML IJ SOLN
INTRAMUSCULAR | Status: AC
  Filled 2023-11-21: qty 2

## 2023-11-21 MED ORDER — LIDOCAINE HCL (PF) 2 % IJ SOLN
INTRAMUSCULAR | Status: AC
  Filled 2023-11-21: qty 5

## 2023-11-21 MED ORDER — KETOROLAC TROMETHAMINE 30 MG/ML IJ SOLN
INTRAMUSCULAR | Status: DC | PRN
  Administered 2023-11-21: 30 mg via INTRAVENOUS

## 2023-11-21 MED ORDER — KETOROLAC TROMETHAMINE 30 MG/ML IJ SOLN
INTRAMUSCULAR | Status: AC
  Filled 2023-11-21: qty 1

## 2023-11-21 MED ORDER — POLYSACCHARIDE IRON COMPLEX 150 MG PO CAPS
150.0000 mg | ORAL_CAPSULE | Freq: Every day | ORAL | Status: DC
  Administered 2023-11-22 – 2023-11-25 (×4): 150 mg via ORAL
  Filled 2023-11-21 (×5): qty 1

## 2023-11-21 MED ORDER — HYDROCORTISONE SOD SUC (PF) 100 MG IJ SOLR
INTRAMUSCULAR | Status: AC
  Filled 2023-11-21: qty 2

## 2023-11-21 MED ORDER — ACETAMINOPHEN 10 MG/ML IV SOLN
INTRAVENOUS | Status: DC | PRN
  Administered 2023-11-21: 1000 mg via INTRAVENOUS

## 2023-11-21 MED ORDER — VITAMIN D (ERGOCALCIFEROL) 1.25 MG (50000 UNIT) PO CAPS
50000.0000 [IU] | ORAL_CAPSULE | ORAL | Status: DC
  Administered 2023-11-21: 50000 [IU] via ORAL
  Filled 2023-11-21: qty 1

## 2023-11-21 MED ORDER — PHENYLEPHRINE 80 MCG/ML (10ML) SYRINGE FOR IV PUSH (FOR BLOOD PRESSURE SUPPORT)
PREFILLED_SYRINGE | INTRAVENOUS | Status: AC
  Filled 2023-11-21: qty 10

## 2023-11-21 MED ORDER — SODIUM CHLORIDE 0.9 % IV SOLN
12.5000 mg | Freq: Four times a day (QID) | INTRAVENOUS | Status: DC | PRN
  Administered 2023-11-21 – 2023-11-23 (×2): 12.5 mg via INTRAVENOUS
  Filled 2023-11-21 (×2): qty 12.5

## 2023-11-21 MED ORDER — LACTATED RINGERS IV SOLN: INTRAVENOUS | Status: DC

## 2023-11-21 MED ORDER — ONDANSETRON HCL 4 MG/2ML IJ SOLN
INTRAMUSCULAR | Status: DC | PRN
  Administered 2023-11-21: 4 mg via INTRAVENOUS

## 2023-11-21 MED ORDER — ROCURONIUM BROMIDE 100 MG/10ML IV SOLN
INTRAVENOUS | Status: DC | PRN
  Administered 2023-11-21: 50 mg via INTRAVENOUS

## 2023-11-21 MED ORDER — LIDOCAINE HCL (CARDIAC) PF 100 MG/5ML IV SOSY
PREFILLED_SYRINGE | INTRAVENOUS | Status: DC | PRN
  Administered 2023-11-21: 100 mg via INTRAVENOUS

## 2023-11-21 MED ORDER — INDOCYANINE GREEN 25 MG IV SOLR
INTRAVENOUS | Status: AC
  Filled 2023-11-21: qty 10

## 2023-11-21 MED ORDER — FENTANYL CITRATE (PF) 100 MCG/2ML IJ SOLN
INTRAMUSCULAR | Status: DC | PRN
  Administered 2023-11-21 (×2): 50 ug via INTRAVENOUS

## 2023-11-21 MED ORDER — BUPIVACAINE-EPINEPHRINE (PF) 0.25% -1:200000 IJ SOLN
INTRAMUSCULAR | Status: DC | PRN
  Administered 2023-11-21: 30 mL

## 2023-11-21 MED ORDER — FENTANYL CITRATE (PF) 100 MCG/2ML IJ SOLN: 25.0000 ug | INTRAMUSCULAR | Status: DC | PRN

## 2023-11-21 MED ORDER — 0.9 % SODIUM CHLORIDE (POUR BTL) OPTIME
TOPICAL | Status: DC | PRN
  Administered 2023-11-21: 500 mL

## 2023-11-21 MED ORDER — LACTATED RINGERS IV SOLN: INTRAVENOUS | Status: DC | PRN

## 2023-11-21 MED ORDER — ONDANSETRON HCL 4 MG/2ML IJ SOLN
INTRAMUSCULAR | Status: AC
  Filled 2023-11-21: qty 2

## 2023-11-21 MED ORDER — K PHOS MONO-SOD PHOS DI & MONO 155-852-130 MG PO TABS
500.0000 mg | ORAL_TABLET | Freq: Four times a day (QID) | ORAL | Status: AC
  Administered 2023-11-21: 500 mg via ORAL
  Filled 2023-11-21: qty 2

## 2023-11-21 MED ORDER — BUPIVACAINE-EPINEPHRINE (PF) 0.25% -1:200000 IJ SOLN
INTRAMUSCULAR | Status: AC
  Filled 2023-11-21: qty 30

## 2023-11-21 MED ORDER — MIDAZOLAM HCL 2 MG/2ML IJ SOLN
INTRAMUSCULAR | Status: DC | PRN
  Administered 2023-11-21: 2 mg via INTRAVENOUS

## 2023-11-21 MED ORDER — DEXAMETHASONE SODIUM PHOSPHATE 10 MG/ML IJ SOLN
INTRAMUSCULAR | Status: AC
  Filled 2023-11-21: qty 1

## 2023-11-21 MED ORDER — SUGAMMADEX SODIUM 200 MG/2ML IV SOLN
INTRAVENOUS | Status: DC | PRN
  Administered 2023-11-21: 200 mg via INTRAVENOUS

## 2023-11-21 MED ORDER — PROPOFOL 10 MG/ML IV BOLUS
INTRAVENOUS | Status: DC | PRN
  Administered 2023-11-21: 125 ug/kg/min via INTRAVENOUS
  Administered 2023-11-21: 150 ug/kg/min via INTRAVENOUS

## 2023-11-21 MED ORDER — CHLORHEXIDINE GLUCONATE 0.12 % MT SOLN
OROMUCOSAL | Status: AC
  Filled 2023-11-21: qty 15

## 2023-11-21 MED ORDER — INDOCYANINE GREEN 25 MG IV SOLR
1.2500 mg | INTRAVENOUS | Status: AC
  Administered 2023-11-21: 1.25 mg via INTRAVENOUS

## 2023-11-21 MED ORDER — PHENYLEPHRINE 80 MCG/ML (10ML) SYRINGE FOR IV PUSH (FOR BLOOD PRESSURE SUPPORT)
PREFILLED_SYRINGE | INTRAVENOUS | Status: DC | PRN
  Administered 2023-11-21: 160 ug via INTRAVENOUS
  Administered 2023-11-21 (×2): 80 ug via INTRAVENOUS
  Administered 2023-11-21: 160 ug via INTRAVENOUS
  Administered 2023-11-21: 80 ug via INTRAVENOUS

## 2023-11-21 SURGICAL SUPPLY — 38 items
BAG PRESSURE INF REUSE 3000 (BAG) IMPLANT
CLIP LIGATING HEM O LOK PURPLE (MISCELLANEOUS) ×1 IMPLANT
COVER TIP SHEARS 8 DVNC (MISCELLANEOUS) ×1 IMPLANT
DEFOGGER SCOPE WARM SEASHARP (MISCELLANEOUS) ×1 IMPLANT
DERMABOND ADVANCED .7 DNX12 (GAUZE/BANDAGES/DRESSINGS) ×1 IMPLANT
DRAPE ARM DVNC X/XI (DISPOSABLE) ×4 IMPLANT
DRAPE COLUMN DVNC XI (DISPOSABLE) ×1 IMPLANT
ELECTRODE REM PT RTRN 9FT ADLT (ELECTROSURGICAL) ×1 IMPLANT
FORCEPS BPLR R/ABLATION 8 DVNC (INSTRUMENTS) ×1 IMPLANT
FORCEPS PROGRASP DVNC XI (FORCEP) ×1 IMPLANT
GLOVE ORTHO TXT STRL SZ7.5 (GLOVE) ×2 IMPLANT
GOWN STRL REUS W/ TWL LRG LVL3 (GOWN DISPOSABLE) ×2 IMPLANT
GOWN STRL REUS W/ TWL XL LVL3 (GOWN DISPOSABLE) ×2 IMPLANT
GRASPER SUT TROCAR 14GX15 (MISCELLANEOUS) ×1 IMPLANT
IRRIGATION STRYKERFLOW (MISCELLANEOUS) IMPLANT
IRRIGATOR SUCT 8 DISP DVNC XI (IRRIGATION / IRRIGATOR) IMPLANT
KIT PINK PAD W/HEAD ARM REST (MISCELLANEOUS) ×1 IMPLANT
LABEL OR SOLS (LABEL) ×1 IMPLANT
MANIFOLD NEPTUNE II (INSTRUMENTS) ×1 IMPLANT
NDL HYPO 22X1.5 SAFETY MO (MISCELLANEOUS) ×1 IMPLANT
NDL INSUFFLATION 14GA 120MM (NEEDLE) ×1 IMPLANT
NEEDLE HYPO 22X1.5 SAFETY MO (MISCELLANEOUS) ×1 IMPLANT
NEEDLE INSUFFLATION 14GA 120MM (NEEDLE) ×1 IMPLANT
NS IRRIG 500ML POUR BTL (IV SOLUTION) ×1 IMPLANT
PACK LAP CHOLECYSTECTOMY (MISCELLANEOUS) ×1 IMPLANT
SCISSORS MNPLR CVD DVNC XI (INSTRUMENTS) ×1 IMPLANT
SEAL UNIV 5-12 XI (MISCELLANEOUS) ×4 IMPLANT
SET TUBE SMOKE EVAC HIGH FLOW (TUBING) ×1 IMPLANT
SOL .9 NS 3000ML IRR UROMATIC (IV SOLUTION) IMPLANT
SOLUTION ELECTROSURG ANTI STCK (MISCELLANEOUS) ×1 IMPLANT
SPIKE FLUID TRANSFER (MISCELLANEOUS) ×1 IMPLANT
SUT VICRYL 0 UR6 27IN ABS (SUTURE) ×1 IMPLANT
SUTURE MNCRL 4-0 27XMF (SUTURE) ×1 IMPLANT
SYRINGE TOOMEY IRRIG 70ML (MISCELLANEOUS) IMPLANT
SYSTEM BAG RETRIEVAL 10MM (BASKET) ×1 IMPLANT
TRAP FLUID SMOKE EVACUATOR (MISCELLANEOUS) ×1 IMPLANT
TROCAR Z-THREAD FIOS 11X100 BL (TROCAR) ×1 IMPLANT
WATER STERILE IRR 500ML POUR (IV SOLUTION) ×1 IMPLANT

## 2023-11-21 NOTE — Progress Notes (Signed)
   11/20/23 0423  Assess: MEWS Score  Temp (!) 100.9 F (38.3 C) (Tylenol  was administered at 0234)  BP 109/63  MAP (mmHg) 76  Pulse Rate (!) 112  Resp 18  SpO2 93 %  O2 Device Room Air  Assess: MEWS Score  MEWS Temp 1  MEWS Systolic 0  MEWS Pulse 2  MEWS RR 0  MEWS LOC 0  MEWS Score 3  MEWS Score Color Yellow  Assess: if the MEWS score is Yellow or Red  Were vital signs accurate and taken at a resting state? Yes  Does the patient meet 2 or more of the SIRS criteria? No  MEWS guidelines implemented  Yes, yellow  Treat  MEWS Interventions Considered administering scheduled or prn medications/treatments as ordered  Take Vital Signs  Increase Vital Sign Frequency  Yellow: Q2hr x1, continue Q4hrs until patient remains green for 12hrs  Escalate  MEWS: Escalate Yellow: Discuss with charge nurse and consider notifying provider and/or RRT  Notify: Charge Nurse/RN  Name of Charge Nurse/RN Notified Tolbert, RN  Provider Notification  Provider Name/Title Delayne Solian, MD  Date Provider Notified 11/20/23  Time Provider Notified 0430  Method of Notification Page  Notification Reason Other (Comment) (yellow mews)  Provider response Other (Comment) (Acknowledged. Patient is being treated with antibiotics due to biliary pancreatitis, cholelithiasis.)  Date of Provider Response 11/20/23  Time of Provider Response 0437  Assess: SIRS CRITERIA  SIRS Temperature  0  SIRS Respirations  0  SIRS Pulse 1  SIRS WBC 0  SIRS Score Sum  1

## 2023-11-21 NOTE — Progress Notes (Signed)
 [facility] Progress Note  Patient: Teresa Rasmussen    FMW:[id number]  DOA: 11/18/2023     Date of Service: the patient was seen and examined on 11/21/2023  Chief Complaint  Patient presents with   Abdominal Pain   Brief hospital course: This is 18 years old female with intellectual disability, Nexplanon in place presented in the [facility] ED with complaining of abdominal pain.  ED workup: VS stable, afebrile BMP: BG 124 elevated Lipase >2800 Alk phos 149, AST 181, ALT 244 elevated.  TP 5.3, direct bili 3.0 CBC: Leukocytosis WBC 17.5 Hepatitis panel negative, EtOH <15, UA negative US  Abd: Cholecystolithiasis with small volume biliary sludge. No changes of acute cholecystitis. MRCP: 1. Findings compatible with acute pancreatitis. No walled-off abscess or pancreatic necrosis. 2. Cholelithiasis without acute cholecystitis. No intra or extrahepatic bile duct dilation. No choledocholithiasis. 3. Mild ascites.   Assessment and Plan:  # Cholelithiasis Continue as needed medication for pain control Continue IV fluid General Surgery consulted 9/25 afebrile, blood cultures ordered, continue empiric antibiotics. 9/26 scheduled for lap chole today  # Acute pancreatitis GI signed off, MRCP negative for CBD stone Continue prn meds for pain control Continue IV fluid for hydration Trend lipase level 2800>>684> 93 CLD  # Hypophosphatemia, Phos repleted with Check electrolytes daily and replete as needed.  # Tachycardia and hypotension could be due to IV pain medication Lactic acid 1.8 negative IV fluid bolus given Continue IV fluid  # Transaminitis secondary to cholelithiasis # Leukocytosis could be reactive versus due to cholelithiasis Procalcitonin negative Continue empiric antibiotics for now Trend LFTs and WBC count  # GERD on PPI  # Vitamin B12 level 289, goal >400.  Started vitamin B12 oral supplement. Follow-up PCP to repeat vitamin B12 level after 3 to 6 months.  #  Iron  deficiency, transferrin saturation is 9% Elevated IV iron  due to possibility of acute infection Start oral iron  tablet with vitamin C  and follow with PCP to repeat iron  profile after 3 to 6 months.  # Obesity class II Body mass index is 36.14 kg/m.  Interventions:Calorie restricted diet and daily exercise advised to lose body weight.  Lifestyle modification discussed.   Diet: NPO MN for Lap-chole DVT Prophylaxis: Subcutaneous Lovenox    Advance goals of care discussion: Full code  Family Communication: family was present at bedside, at the time of interview.  The pt provided permission to discuss medical plan with the family. Opportunity was given to ask question and all questions were answered satisfactorily.   Disposition:  Pt is from Home, admitted with cholelithiasis, pancreatitis and transaminitis, still has intractable pain and electrolyte imbalance, which precludes a safe discharge. Discharge to home, when stable, may need few days to improve.  Subjective: No significant events overnight, patient was still having significant abdominal pain and she was about to go for lap chole and she was nervous.  Family was at bedside.   Physical Exam: General: NAD, lying comfortably Appear in mild-mod distress due to Abd pain, affect appropriate Eyes: PERRLA ENT: Oral Mucosa Clear, moist  Neck: no JVD,  Cardiovascular: S1 and S2 Present, no Murmur,  Respiratory: good respiratory effort, Bilateral Air entry equal and Decreased, no Crackles, no wheezes Abdomen: BS present, Soft and mild epigastric/ RUQ tenderness,  Skin: no rashes Extremities: no Pedal edema, no calf tenderness Neurologic: without any new focal findings Gait not checked due to patient safety concerns  Vitals:   11/21/23 0013 11/21/23 0506 11/21/23 0730 11/21/23 1121  BP: 128/81 (!) 99/59 108/66  124/87  Pulse: (!) 112 (!) 115 (!) 110 (!) 118  Resp: 20 18 17 18   Temp: 99.6 F (37.6 C) 98.7 F (37.1 C) 98.9 F  (37.2 C) 98.2 F (36.8 C)  TempSrc: Oral   Temporal  SpO2: 95% 92% 92% 95%  Weight:      Height:        Intake/Output Summary (Last 24 hours) at 11/21/2023 1328 Last data filed at 11/21/2023 1326 Gross per 24 hour  Intake 1550 ml  Output 5 ml  Net 1545 ml   Filed Weights   11/18/23 1840  Weight: 78.4 kg    Data Reviewed: I have personally reviewed and interpreted daily labs, tele strips, imagings as discussed above. I reviewed all nursing notes, pharmacy notes, vitals, pertinent old records I have discussed plan of care as described above with RN and patient/family.  CBC: Recent Labs  Lab 11/18/23 1417 11/18/23 1920 11/19/23 1012 11/20/23 0224 11/21/23 0538  WBC 16.0* 17.5* 19.6* 15.8* 18.8*  HGB 14.3 13.7 14.1 12.5 11.1*  HCT 45.7 43.6 44.2 39.9 34.7*  MCV 99.1 98.9 97.6 99.3 97.7  PLT 226 217 190 182 162   Basic Metabolic Panel: Recent Labs  Lab 11/18/23 1417 11/18/23 1920 11/19/23 1012 11/20/23 0224 11/21/23 0538  NA 140  --  143 138 139  K 3.9  --  3.8 3.6 3.6  CL 106  --  107 102 106  CO2 23  --  23 26 24   GLUCOSE 174*  --  109* 120* 106*  BUN 8  --  10 8 5*  CREATININE 0.69 0.62 0.73 0.88 0.54  CALCIUM 9.1  --  8.7* 8.1* 7.6*  MG  --   --  2.0 2.2 1.8  PHOS  --   --  2.7 2.4* 2.3*    Studies: No results found.   Scheduled Meds:  [MAR Hold] chlorhexidine   15 mL Mouth/Throat Once   [MAR Hold] vitamin B-12  1,000 mcg Oral Daily   [MAR Hold] enoxaparin  (LOVENOX ) injection  40 mg Subcutaneous Q24H   influenza vac split trivalent PF  0.5 mL Intramuscular Tomorrow-1000   [MAR Hold] pantoprazole  (PROTONIX ) IV  40 mg Intravenous Q24H   [MAR Hold] phosphorus  500 mg Oral QID   [MAR Hold] sertraline   50 mg Oral Daily   Continuous Infusions:  sodium chloride  1,000 mL (11/20/23 1427)   [MAR Hold] piperacillin -tazobactam (ZOSYN )  IV 3.375 g (11/21/23 0234)   [MAR Hold] promethazine  (PHENERGAN ) injection (IM or IVPB) 12.5 mg (11/21/23 0110)   PRN  Meds: 0.9 % irrigation (POUR BTL), [MAR Hold] acetaminophen , bupivacaine -epinephrine  (PF), [MAR Hold] docusate sodium , [MAR Hold]  HYDROmorphone  (DILAUDID ) injection, [MAR Hold] ondansetron  (ZOFRAN ) IV, [MAR Hold] oxyCODONE , [MAR Hold] promethazine  (PHENERGAN ) injection (IM or IVPB)  Time spent: 40 minutes  Author: ELVAN SOR. MD [facility] 11/21/2023 1:28 PM  To reach On-call, see care teams to locate the attending and reach out to them via [email or web address]. If 7PM-7AM, please contact night-coverage If you still have difficulty reaching the attending provider, please page the [facility] (Director on Call) for [facility] on amion for assistance.

## 2023-11-21 NOTE — H&P (View-Only) (Signed)
 [facility] SURGICAL PROGRESS NOTE  Hospital Day(s): 3.   Interval History:  Patient seen and examined No acute events or new complaints overnight. She does continue to have upper abdominal pain  No longer with fevers in the last 24 hours.  She does continue to have leukocytosis; WBC 18.8K this AM.  Hgb to 11.1.  Renal function normal; sCr - 0.54; UO - unmeasured.  No electrolyte derangements.  Hypophosphatemia to 2.3.  Total bilirubinemia to 1.4 (indirect 1.0).  Lipase is 91.  She is NPO this morning.  Continues on Zosyn    Vital signs in last 24 hours: [min-max] current  Temp:  [98.1 F (36.7 C)-99.6 F (37.6 C)] 98.7 F (37.1 C) (09/26 0506) Pulse Rate:  [112-132] 115 (09/26 0506) Resp:  [17-21] 18 (09/26 0506) BP: (89-128)/(52-81) 99/59 (09/26 0506) SpO2:  [87 %-95 %] 92 % (09/26 0506)     Height: 4' 10 (147.3 cm) Weight: 78.4 kg     Intake/Output last 2 shifts:  09/25 0701 - 09/26 0700 In: 1400 [I.V.:1200; IV Piggyback:200] Out: -    Physical Exam:  Constitutional: alert, cooperative and no distress  HENT: normocephalic without obvious abnormality  Eyes: PERRL, EOM's grossly intact and symmetric  Respiratory: breathing non-labored at rest  Cardiovascular: regular rate and sinus rhythm  Gastrointestinal: soft, epigastric tenderness, and non-distended. She is not peritonitic    Labs:     Latest Ref Rng & Units 11/21/2023    5:38 AM 11/20/2023    2:24 AM 11/19/2023   10:12 AM  CBC  WBC 4.0 - 10.5 K/uL 18.8  15.8  19.6   Hemoglobin 12.0 - 15.0 g/dL 88.8  87.4  85.8   Hematocrit 36.0 - 46.0 % 34.7  39.9  44.2   Platelets 150 - 400 K/uL 162  182  190       Latest Ref Rng & Units 11/21/2023    5:38 AM 11/20/2023    2:24 AM 11/19/2023   10:12 AM  CMP  Glucose 70 - 99 mg/dL 893  879  890   BUN 6 - 20 mg/dL 5  8  10    Creatinine 0.44 - 1.00 mg/dL 9.45  9.11  9.26   Sodium 135 - 145 mmol/L 139  138  143   Potassium 3.5 - 5.1 mmol/L 3.6  3.6  3.8    Chloride 98 - 111 mmol/L 106  102  107   CO2 22 - 32 mmol/L 24  26  23    Calcium 8.9 - 10.3 mg/dL 7.6  8.1  8.7   Total Protein 6.5 - 8.1 g/dL 6.4  7.2  8.0   Total Bilirubin 0.0 - 1.2 mg/dL 1.4  1.5  2.0   Alkaline Phos 38 - 126 U/L 98  129  134   AST 15 - 41 U/L 23  48  74   ALT 0 - 44 U/L 77  124  179     Imaging studies: No new pertinent imaging studies   Assessment/Plan:  18 y.o. female with gallstone pancreatitis without choledocholithiasis on MRCP   - We will plan to proceed with robotic assisted laparoscopic cholecystectomy this afternoon with Dr Lane pending OR/Anesthesia availability  - All risks, benefits, and alternatives to above procedure(s) were discussed with the patient and her family, all of their questions were answered to their expressed satisfaction, patient expresses she wishes to proceed, and informed consent was obtained.   - Continue NPO; okay sips with medications  - ICG on  call to OR  - Continue IV Abx (Zosyn ) - Monitor abdominal examination; on-going bowel function - Monitor leukocytosis; worsened - Monitor lipase; improving - Monitor hyperbilirubinemia; improving - Pain control prn; antiemetics prn   - Mobilize - Further management per primary service; we will follow along   All of the above findings and recommendations were discussed with the patient, her family, and the medical team, and all of patient's questions were answered to her expressed satisfaction.  -- Arthea Platt, PA-C [facility] 11/21/2023, 7:25 AM M-F: 7am - 4pm

## 2023-11-21 NOTE — Anesthesia Preprocedure Evaluation (Signed)
 Anesthesia Evaluation  Patient identified by MRN, date of birth, ID band Patient awake    Reviewed: Allergy & Precautions, NPO status , Patient's Chart, lab work & pertinent test results  History of Anesthesia Complications Negative for: history of anesthetic complications  Airway Mallampati: IV   Neck ROM: Full    Dental no notable dental hx.    Pulmonary neg pulmonary ROS   Pulmonary exam normal breath sounds clear to auscultation       Cardiovascular Exercise Tolerance: Good negative cardio ROS Normal cardiovascular exam Rhythm:Regular Rate:Normal     Neuro/Psych Intellectual disability    GI/Hepatic ,GERD  ,,  Endo/Other  Obesity   Renal/GU negative Renal ROS     Musculoskeletal   Abdominal   Peds  Hematology negative hematology ROS (+)   Anesthesia Other Findings   Reproductive/Obstetrics                              Anesthesia Physical Anesthesia Plan  ASA: 2  Anesthesia Plan: General   Post-op Pain Management:    Induction: Intravenous  PONV Risk Score and Plan: 3 and Ondansetron , Dexamethasone  and Treatment may vary due to age or medical condition  Airway Management Planned: Oral ETT  Additional Equipment:   Intra-op Plan:   Post-operative Plan: Extubation in OR  Informed Consent: I have reviewed the patients History and Physical, chart, labs and discussed the procedure including the risks, benefits and alternatives for the proposed anesthesia with the patient or authorized representative who has indicated his/her understanding and acceptance.     Dental advisory given and Consent reviewed with POA  Plan Discussed with: CRNA  Anesthesia Plan Comments: (Patient and mother at bedside consented for risks of anesthesia including but not limited to:  - adverse reactions to medications - damage to eyes, teeth, lips or other oral mucosa - nerve damage due to  positioning  - sore throat or hoarseness - damage to heart, brain, nerves, lungs, other parts of body or loss of life  Informed patient and mother about role of CRNA in peri- and intra-operative care; they voiced understanding.)        Anesthesia Quick Evaluation

## 2023-11-21 NOTE — Interval H&P Note (Signed)
 History and Physical Interval Note:  11/21/2023 11:46 AM  Teresa Rasmussen  has presented today for surgery, with the diagnosis of Gallstone pancreatitis.  The various methods of treatment have been discussed with the patient and family. After consideration of risks, benefits and other options for treatment, the patient has consented to  Procedure(s): CHOLECYSTECTOMY, ROBOT-ASSISTED, LAPAROSCOPIC (N/A) as a surgical intervention.  The patient's history has been reviewed, patient examined, no change in status, stable for surgery.  I have reviewed the patient's chart and labs.  Questions were answered to the patient's satisfaction.     Honor Leghorn

## 2023-11-21 NOTE — Plan of Care (Signed)

## 2023-11-21 NOTE — Plan of Care (Signed)

## 2023-11-21 NOTE — Transfer of Care (Signed)
 Immediate Anesthesia Transfer of Care Note  Patient: Teresa Rasmussen  Procedure(s) Performed: CHOLECYSTECTOMY, ROBOT-ASSISTED, LAPAROSCOPIC  Patient Location: PACU  Anesthesia Type:General  Level of Consciousness: awake  Airway & Oxygen Therapy: Patient Spontanous Breathing and Patient connected to face mask oxygen  Post-op Assessment: Report given to RN and Post -op Vital signs reviewed and stable  Post vital signs: Reviewed and stable  Last Vitals:  Vitals Value Taken Time  BP 89/49 11/21/23 13:52  Temp 37.2 C 11/21/23 13:52  Pulse 58 11/21/23 13:56  Resp 20 11/21/23 13:56  SpO2 100 % 11/21/23 13:56  Vitals shown include unfiled device data.  Last Pain:  Vitals:   11/21/23 1352  TempSrc:   PainSc: Asleep      Patients Stated Pain Goal: 0 (11/21/23 0007)  Complications:  Encounter Notable Events  Notable Event Outcome Phase Comment  Desaturation < 90% for over 3 min or < 80% for over 1 min  Intraprocedure pre-op O2 saturation 78%; dificult to recruit O2 saturation prior to intubation  Difficult mask airway  Intraprocedure OAW and 2 hande mask required

## 2023-11-21 NOTE — Progress Notes (Signed)
 [facility] SURGICAL PROGRESS NOTE  Hospital Day(s): 3.   Interval History:  Patient seen and examined No acute events or new complaints overnight. She does continue to have upper abdominal pain  No longer with fevers in the last 24 hours.  She does continue to have leukocytosis; WBC 18.8K this AM.  Hgb to 11.1.  Renal function normal; sCr - 0.54; UO - unmeasured.  No electrolyte derangements.  Hypophosphatemia to 2.3.  Total bilirubinemia to 1.4 (indirect 1.0).  Lipase is 91.  She is NPO this morning.  Continues on Zosyn    Vital signs in last 24 hours: [min-max] current  Temp:  [98.1 F (36.7 C)-99.6 F (37.6 C)] 98.7 F (37.1 C) (09/26 0506) Pulse Rate:  [112-132] 115 (09/26 0506) Resp:  [17-21] 18 (09/26 0506) BP: (89-128)/(52-81) 99/59 (09/26 0506) SpO2:  [87 %-95 %] 92 % (09/26 0506)     Height: 4' 10 (147.3 cm) Weight: 78.4 kg     Intake/Output last 2 shifts:  09/25 0701 - 09/26 0700 In: 1400 [I.V.:1200; IV Piggyback:200] Out: -    Physical Exam:  Constitutional: alert, cooperative and no distress  HENT: normocephalic without obvious abnormality  Eyes: PERRL, EOM's grossly intact and symmetric  Respiratory: breathing non-labored at rest  Cardiovascular: regular rate and sinus rhythm  Gastrointestinal: soft, epigastric tenderness, and non-distended. She is not peritonitic    Labs:     Latest Ref Rng & Units 11/21/2023    5:38 AM 11/20/2023    2:24 AM 11/19/2023   10:12 AM  CBC  WBC 4.0 - 10.5 K/uL 18.8  15.8  19.6   Hemoglobin 12.0 - 15.0 g/dL 88.8  87.4  85.8   Hematocrit 36.0 - 46.0 % 34.7  39.9  44.2   Platelets 150 - 400 K/uL 162  182  190       Latest Ref Rng & Units 11/21/2023    5:38 AM 11/20/2023    2:24 AM 11/19/2023   10:12 AM  CMP  Glucose 70 - 99 mg/dL 893  879  890   BUN 6 - 20 mg/dL 5  8  10    Creatinine 0.44 - 1.00 mg/dL 9.45  9.11  9.26   Sodium 135 - 145 mmol/L 139  138  143   Potassium 3.5 - 5.1 mmol/L 3.6  3.6  3.8    Chloride 98 - 111 mmol/L 106  102  107   CO2 22 - 32 mmol/L 24  26  23    Calcium 8.9 - 10.3 mg/dL 7.6  8.1  8.7   Total Protein 6.5 - 8.1 g/dL 6.4  7.2  8.0   Total Bilirubin 0.0 - 1.2 mg/dL 1.4  1.5  2.0   Alkaline Phos 38 - 126 U/L 98  129  134   AST 15 - 41 U/L 23  48  74   ALT 0 - 44 U/L 77  124  179     Imaging studies: No new pertinent imaging studies   Assessment/Plan:  18 y.o. female with gallstone pancreatitis without choledocholithiasis on MRCP   - We will plan to proceed with robotic assisted laparoscopic cholecystectomy this afternoon with Dr Lane pending OR/Anesthesia availability  - All risks, benefits, and alternatives to above procedure(s) were discussed with the patient and her family, all of their questions were answered to their expressed satisfaction, patient expresses she wishes to proceed, and informed consent was obtained.   - Continue NPO; okay sips with medications  - ICG on  call to OR  - Continue IV Abx (Zosyn ) - Monitor abdominal examination; on-going bowel function - Monitor leukocytosis; worsened - Monitor lipase; improving - Monitor hyperbilirubinemia; improving - Pain control prn; antiemetics prn   - Mobilize - Further management per primary service; we will follow along   All of the above findings and recommendations were discussed with the patient, her family, and the medical team, and all of patient's questions were answered to her expressed satisfaction.  -- Arthea Platt, PA-C [facility] 11/21/2023, 7:25 AM M-F: 7am - 4pm

## 2023-11-21 NOTE — Op Note (Signed)
 Robotic cholecystectomy with Indocyamine Green Ductal Imaging.   Pre-operative Diagnosis: Biliary pancreatitis,  calculus cholecystitis  Post-operative Diagnosis:  Same.  Procedure: Robotic assisted laparoscopic cholecystectomy with Indocyamine Green Ductal Imaging.   Surgeon: Honor Leghorn, M.D., FACS  Anesthesia: General. with endotracheal tube  Findings: as expected, edematous GB with peritoneal fluid c/w recent pancreatitis.   Estimated Blood Loss: 10 mL         Drains: None         Specimens: Gallbladder           Complications: none  Procedure Details  The patient was seen again in the Holding Room.  1.25 mg dose of ICG was administered intravenously.   The benefits, complications, treatment options, risks and expected outcomes were again reviewed with the patient. The likelihood of improving the patient's symptoms with return to their baseline status is good.  The patient and/or family concurred with the proposed plan, giving informed consent, again alternatives reviewed.  The patient was taken to Operating Room, identified, and the procedure verified as robotic assisted laparoscopic cholecystectomy.  Prior to the induction of general anesthesia, antibiotic prophylaxis was administered. VTE prophylaxis was in place. General endotracheal anesthesia was then administered and tolerated well. The patient was positioned in the supine position.  After the induction, the abdomen was prepped with Chloraprep and draped in the sterile fashion.  A Time Out was held and the above information confirmed.  After local infiltration of quarter percent Marcaine  with epinephrine , stab incision was made left upper quadrant.  Just below the costal margin at Palmer's point, approximately midclavicular line the Veres needle is passed with sensation of the layers to penetrate the abdominal wall and into the peritoneum.  Saline drop test is confirmed peritoneal placement.  Insufflation is initiated with  carbon dioxide to pressures of 15 mmHg.  Local infiltration with 0.25% Marcaine  with epinephrine  is utilized for all skin incisions.  Made a 12 mm incision on the right periumbilical site, I advanced an optical 11mm port under direct visualization into the peritoneal cavity.  Once the peritoneum was penetrated, insufflation was initiated.  The trocar was then advanced into the abdominal cavity under direct visualization. Pneumoperitoneum was then continued utilizing CO2 at 15 mmHg or less and tolerated well without any adverse changes in the patient's vital signs.  Two 8.5-mm ports were placed in the left lower quadrant and laterally, and one to the right lower quadrant, all under direct vision. Local infiltration with a mixture of Exparel  and 0.25% Marcaine  with epinephrine  is utilized for all port sites with deep infiltration under visualization.   The patient was positioned  in reverse Trendelenburg, tilted the patient's left side down.  Da Vinci XI robot was then positioned on to the patient's left side, and docked.  The gallbladder was identified, the fundus grasped via the arm 4 Prograsp and retracted cephalad. Adhesions were lysed with scissors and cautery.  The infundibulum was identified grasped and retracted laterally, exposing the peritoneum overlying the triangle of Calot. This was then opened and dissected using cautery & scissors.  We did achieve the critical view of safety with 2 and only 2 structures entering the gallbladder and we were able to see the liver plate posterior to this from both sides;with the extended critical view of the cystic duct and cystic artery obtained, aided by the ICG via FireFly which improved localization of the major ductal anatomy.    The cystic duct was clearly identified and dissected to isolation.  Artery well isolated and clipped, and the cystic duct was triple clipped and divided with scissors, as close to the gallbladder neck as feasible, thus leaving two  on the remaining stump.  The specimen side of the artery is sealed with bipolar and divided with monopolar scissors.   The gallbladder was taken from the gallbladder fossa in a retrograde fashion with the electrocautery. The gallbladder was removed and placed in an Endocatch bag.  The liver bed is inspected. Hemostasis was confirmed.  The robot was undocked and moved away from the operative field. No irrigation was utilized.  The gallbladder and Endocatch sac were then removed through the paraumbilical port site.   Inspection of the right upper quadrant was performed. No bleeding, bile duct injury or leak, or bowel injury was noted. The infra-umbilical port site fascia was closed with interrumpted 0 Vicryl sutures using PMI/cone under direct visualization. Pneumoperitoneum was released and ports removed.  4-0 subcuticular Monocryl was used to close the skin. Dermabond was  applied.  The patient was then extubated and brought to the recovery room in stable condition. Sponge, lap, and needle counts were correct at closure and at the conclusion of the case.               Honor Leghorn, M.D., [facility] 11/21/2023 9:33 PM

## 2023-11-21 NOTE — Anesthesia Procedure Notes (Signed)
 Procedure Name: Intubation Date/Time: 11/21/2023 12:31 PM  Performed by: Bonnetta Jimmey SAUNDERS, CRNAPre-anesthesia Checklist: Patient identified, Emergency Drugs available, Suction available and Patient being monitored Patient Re-evaluated:Patient Re-evaluated prior to induction Oxygen Delivery Method: Circle system utilized Preoxygenation: Pre-oxygenation with 100% oxygen Induction Type: IV induction Ventilation: Mask ventilation without difficulty Laryngoscope Size: McGrath and 3 Grade View: Grade I Tube type: Oral Tube size: 6.5 mm Number of attempts: 1 Airway Equipment and Method: Stylet and Oral airway Placement Confirmation: ETT inserted through vocal cords under direct vision, positive ETCO2 and breath sounds checked- equal and bilateral Secured at: 21 cm Tube secured with: Tape Dental Injury: Teeth and Oropharynx as per pre-operative assessment

## 2023-11-22 DIAGNOSIS — K8012 Calculus of gallbladder with acute and chronic cholecystitis without obstruction: Secondary | ICD-10-CM | POA: Diagnosis not present

## 2023-11-22 LAB — HEPATIC FUNCTION PANEL
ALT: 64 U/L — ABNORMAL HIGH (ref 0–44)
AST: 20 U/L (ref 15–41)
Albumin: 3.1 g/dL — ABNORMAL LOW (ref 3.5–5.0)
Alkaline Phosphatase: 95 U/L (ref 38–126)
Bilirubin, Direct: 0.2 mg/dL (ref 0.0–0.2)
Indirect Bilirubin: 0.8 mg/dL (ref 0.3–0.9)
Total Bilirubin: 1 mg/dL (ref 0.0–1.2)
Total Protein: 7.1 g/dL (ref 6.5–8.1)

## 2023-11-22 LAB — CBC
HCT: 33.5 % — ABNORMAL LOW (ref 36.0–46.0)
Hemoglobin: 10.7 g/dL — ABNORMAL LOW (ref 12.0–15.0)
MCH: 30.8 pg (ref 26.0–34.0)
MCHC: 31.9 g/dL (ref 30.0–36.0)
MCV: 96.5 fL (ref 80.0–100.0)
Platelets: 174 K/uL (ref 150–400)
RBC: 3.47 MIL/uL — ABNORMAL LOW (ref 3.87–5.11)
RDW: 14.1 % (ref 11.5–15.5)
WBC: 14.3 K/uL — ABNORMAL HIGH (ref 4.0–10.5)
nRBC: 0 % (ref 0.0–0.2)

## 2023-11-22 LAB — BASIC METABOLIC PANEL WITH GFR
Anion gap: 9 (ref 5–15)
BUN: 10 mg/dL (ref 6–20)
CO2: 24 mmol/L (ref 22–32)
Calcium: 8.1 mg/dL — ABNORMAL LOW (ref 8.9–10.3)
Chloride: 106 mmol/L (ref 98–111)
Creatinine, Ser: 0.53 mg/dL (ref 0.44–1.00)
GFR, Estimated: >60 mL/min (ref >60)
Glucose, Bld: 112 mg/dL — ABNORMAL HIGH (ref 70–99)
Potassium: 3.6 mmol/L (ref 3.5–5.1)
Sodium: 139 mmol/L (ref 135–145)

## 2023-11-22 LAB — MAGNESIUM: Magnesium: 2.1 mg/dL (ref 1.7–2.4)

## 2023-11-22 LAB — PHOSPHORUS: Phosphorus: 3.1 mg/dL (ref 2.5–4.6)

## 2023-11-22 LAB — LIPASE, BLOOD: Lipase: 35 U/L (ref 11–51)

## 2023-11-22 MED ORDER — BUTALBITAL-APAP-CAFFEINE 50-325-40 MG PO TABS
1.0000 | ORAL_TABLET | Freq: Four times a day (QID) | ORAL | Status: DC | PRN
  Administered 2023-11-22 – 2023-11-25 (×5): 1 via ORAL
  Filled 2023-11-22 (×5): qty 1

## 2023-11-22 NOTE — Progress Notes (Signed)
 [facility] SURGICAL PROGRESS NOTE  Hospital Day(s): 4.   Post op day(s): 1 Day Post-Op.   Interval History: Patient seen and examined, no acute events or new complaints overnight. Patient reports tolerance of ice cream this morning, did not like the oatmeal.  Denies nausea or vomiting.  Much improved pain control.  White blood cell count improved, uncertain etiology.  Review of Systems:  Constitutional: denies fever, chills  Respiratory: denies any shortness of breath  Cardiovascular: denies chest pain or palpitations  Gastrointestinal: denies uncontrolled abdominal pain, N/V, or diarrhea/and bowel function as per interval history Musculoskeletal: denies pain, decreased motor or sensation Integumentary: denies any other rashes or skin discolorations  Vital signs in last 24 hours: [min-max] current  Temp:  [98.1 F (36.7 C)-99.1 F (37.3 C)] 98.9 F (37.2 C) (09/27 0744) Pulse Rate:  [78-100] 94 (09/27 0744) Resp:  [10-22] 15 (09/27 0744) BP: (89-116)/(49-69) 111/69 (09/27 0744) SpO2:  [91 %-100 %] 94 % (09/27 0744)     Height: 4' 10 (147.3 cm) Weight: 78.4 kg     Intake/Output last 2 shifts:  09/26 0701 - 09/27 0700 In: 2451.4 [I.V.:2201.4; IV Piggyback:250] Out: 5 [Blood:5]   Physical Exam:  Constitutional: alert, cooperative and no distress  Respiratory: breathing non-labored at rest  Cardiovascular: regular rate and sinus rhythm  Gastrointestinal: soft, non-tender, and non-distended Integumentary: Incisions are clean dry and intact.  Labs:     Latest Ref Rng & Units 11/22/2023    3:17 AM 11/21/2023    5:38 AM 11/20/2023    2:24 AM  CBC  WBC 4.0 - 10.5 K/uL 14.3  18.8  15.8   Hemoglobin 12.0 - 15.0 g/dL 89.2  88.8  87.4   Hematocrit 36.0 - 46.0 % 33.5  34.7  39.9   Platelets 150 - 400 K/uL 174  162  182       Latest Ref Rng & Units 11/22/2023    3:17 AM 11/21/2023    5:38 AM 11/20/2023    2:24 AM  CMP  Glucose 70 - 99 mg/dL 887  893  879   BUN  6 - 20 mg/dL 10  5  8    Creatinine 0.44 - 1.00 mg/dL 9.46  9.45  9.11   Sodium 135 - 145 mmol/L 139  139  138   Potassium 3.5 - 5.1 mmol/L 3.6  3.6  3.6   Chloride 98 - 111 mmol/L 106  106  102   CO2 22 - 32 mmol/L 24  24  26    Calcium 8.9 - 10.3 mg/dL 8.1  7.6  8.1   Total Protein 6.5 - 8.1 g/dL 7.1  6.4  7.2   Total Bilirubin 0.0 - 1.2 mg/dL 1.0  1.4  1.5   Alkaline Phos 38 - 126 U/L 95  98  129   AST 15 - 41 U/L 20  23  48   ALT 0 - 44 U/L 64  77  124      Imaging studies: No new pertinent imaging studies   Assessment/Plan:  18 y.o. female with gallstone pancreatitis 1 Day Post-Op s/p robotic cholecystectomy for same, complicated by pertinent comorbidities including:  Patient Active Problem List   Diagnosis Date Noted   Abnormal LFTs (liver function tests) 11/20/2023   Gallstone pancreatitis 11/20/2023   Acute biliary pancreatitis without infection or necrosis 11/19/2023   Cholelithiasis 11/18/2023    - Despite her leukocytosis, I am not convinced antibiotics are helpful, though uncertain of the source it  may be prudent to discharge her with p.o. antibiotics for course.  - I believe she can be discharged safely on oral pain medications, regular diet and outpatient follow-up with us  in 1 to 2 weeks.  - We appreciate your assistance with this young lady.  - We remain available to assist in any way.  All of the above findings and recommendations were discussed with the patient, and all of patient's questions were answered to their expressed satisfaction.  -- Honor Leghorn, M.D., DOYAL 11/22/2023

## 2023-11-22 NOTE — Plan of Care (Signed)

## 2023-11-22 NOTE — Progress Notes (Signed)
 [facility] Progress Note  Patient: Teresa Rasmussen    FMW:[id number]  DOA: 11/18/2023     Date of Service: the patient was seen and examined on 11/22/2023  Chief Complaint  Patient presents with   Abdominal Pain   Brief hospital course: This is 18 years old female with intellectual disability, Nexplanon in place presented in the [facility] ED with complaining of abdominal pain.  ED workup: VS stable, afebrile BMP: BG 124 elevated Lipase >2800 Alk phos 149, AST 181, ALT 244 elevated.  TP 5.3, direct bili 3.0 CBC: Leukocytosis WBC 17.5 Hepatitis panel negative, EtOH <15, UA negative US  Abd: Cholecystolithiasis with small volume biliary sludge. No changes of acute cholecystitis. MRCP: 1. Findings compatible with acute pancreatitis. No walled-off abscess or pancreatic necrosis. 2. Cholelithiasis without acute cholecystitis. No intra or extrahepatic bile duct dilation. No choledocholithiasis. 3. Mild ascites.   Assessment and Plan:  # Cholelithiasis Continue as needed medication for pain control Continue IV fluid General Surgery consulted 9/25 afebrile, blood cultures ordered, continue empiric antibiotics. 9/26 s/p Robotic assisted laparoscopic cholecystectomy with Indocyamine Green Ductal Imaging.  9/27 patient has any abdominal pain, still has some nausea no vomiting.  BP still soft, patient is feeling little bit dizziness well ambulation.  We will continue to monitor today.  WBC count trending down.  Continue to biotics and plan for discharge tomorrow a.m.   # Acute pancreatitis GI signed off, MRCP negative for CBD stone Continue prn meds for pain control S/p IV fluid for hydration Trend lipase level 2800>>684> 93 Regular diet  # Hypophosphatemia, Phos repleted with Check electrolytes daily and replete as needed.  # Tachycardia and hypotension could be due to IV pain medication Lactic acid 1.8 negative IV fluid bolus given Continue IV fluid  # Transaminitis  secondary to cholelithiasis # Leukocytosis could be reactive versus due to cholelithiasis Procalcitonin negative Continue empiric antibiotics for now Trend LFTs and WBC count  # GERD on PPI  # Vitamin B12 level 289, goal >400.  Started vitamin B12 oral supplement. Follow-up PCP to repeat vitamin B12 level after 3 to 6 months.  # Iron  deficiency, transferrin saturation is 9% Elevated IV iron  due to possibility of acute infection Start oral iron  tablet with vitamin C  and follow with PCP to repeat iron  profile after 3 to 6 months.  # Vitamin D  insufficiency: started vitamin D  50,000 units p.o. weekly, follow with PCP to repeat vitamin D  level after 3 to 6 months.   # Obesity class II Body mass index is 36.14 kg/m.  Interventions:Calorie restricted diet and daily exercise advised to lose body weight.  Lifestyle modification discussed.   Diet: Soft diet DVT Prophylaxis: Subcutaneous Lovenox    Advance goals of care discussion: Full code  Family Communication: family was present at bedside, at the time of interview.  The pt provided permission to discuss medical plan with the family. Opportunity was given to ask question and all questions were answered satisfactorily.   Disposition:  Pt is from Home, admitted with cholelithiasis, pancreatitis and transaminitis, still has intractable pain and electrolyte imbalance, which precludes a safe discharge. Discharge to home, when stable, may need few days to improve.  Subjective: No significant events overnight, patient feels improvement in the abdominal pain, no pain at this time having some nausea, no vomiting.  Patient stated that she feels a dizzy while walking around.  Otherwise no new complaints Discharge plan discussed with patient and family at bedside, they would like to stay another night and DC  plan tomorrow a.m.   Physical Exam: General: NAD, lying comfortably Appear in mild-mod distress due to Abd pain, affect appropriate Eyes:  PERRLA ENT: Oral Mucosa Clear, moist  Neck: no JVD,  Cardiovascular: S1 and S2 Present, no Murmur,  Respiratory: good respiratory effort, Bilateral Air entry equal and Decreased, no Crackles, no wheezes Abdomen: BS present, Soft and mild post op RUQ tenderness,  Skin: no rashes Extremities: no Pedal edema, no calf tenderness Neurologic: without any new focal findings Gait not checked due to patient safety concerns  Vitals:   11/21/23 1935 11/22/23 0125 11/22/23 0457 11/22/23 0744  BP: 101/61  99/68 111/69  Pulse: 78  80 94  Resp: 15  16 15   Temp: 98.5 F (36.9 C) 99.1 F (37.3 C) 98.7 F (37.1 C) 98.9 F (37.2 C)  TempSrc: Oral Oral  Oral  SpO2: 94%  99% 94%  Weight:      Height:        Intake/Output Summary (Last 24 hours) at 11/22/2023 1103 Last data filed at 11/22/2023 0900 Gross per 24 hour  Intake 2691.36 ml  Output 5 ml  Net 2686.36 ml   Filed Weights   11/18/23 1840  Weight: 78.4 kg    Data Reviewed: I have personally reviewed and interpreted daily labs, tele strips, imagings as discussed above. I reviewed all nursing notes, pharmacy notes, vitals, pertinent old records I have discussed plan of care as described above with RN and patient/family.  CBC: Recent Labs  Lab 11/18/23 1920 11/19/23 1012 11/20/23 0224 11/21/23 0538 11/22/23 0317  WBC 17.5* 19.6* 15.8* 18.8* 14.3*  HGB 13.7 14.1 12.5 11.1* 10.7*  HCT 43.6 44.2 39.9 34.7* 33.5*  MCV 98.9 97.6 99.3 97.7 96.5  PLT 217 190 182 162 174   Basic Metabolic Panel: Recent Labs  Lab 11/18/23 1417 11/18/23 1920 11/19/23 1012 11/20/23 0224 11/21/23 0538 11/22/23 0317  NA 140  --  143 138 139 139  K 3.9  --  3.8 3.6 3.6 3.6  CL 106  --  107 102 106 106  CO2 23  --  23 26 24 24   GLUCOSE 174*  --  109* 120* 106* 112*  BUN 8  --  10 8 5* 10  CREATININE 0.69 0.62 0.73 0.88 0.54 0.53  CALCIUM 9.1  --  8.7* 8.1* 7.6* 8.1*  MG  --   --  2.0 2.2 1.8 2.1  PHOS  --   --  2.7 2.4* 2.3* 3.1     Studies: No results found.   Scheduled Meds:  ascorbic acid   500 mg Oral Daily   chlorhexidine   15 mL Mouth/Throat Once   vitamin B-12  1,000 mcg Oral Daily   enoxaparin  (LOVENOX ) injection  40 mg Subcutaneous Q24H   influenza vac split trivalent PF  0.5 mL Intramuscular Tomorrow-1000   iron  polysaccharides  150 mg Oral Daily   pantoprazole  (PROTONIX ) IV  40 mg Intravenous Q24H   sertraline   50 mg Oral Daily   Vitamin D  (Ergocalciferol )  50,000 Units Oral Q7 days   Continuous Infusions:  sodium chloride  100 mL/hr at 11/22/23 0544   piperacillin -tazobactam (ZOSYN )  IV 3.375 g (11/22/23 0859)   promethazine  (PHENERGAN ) injection (IM or IVPB) 12.5 mg (11/21/23 0110)   PRN Meds: acetaminophen , docusate sodium , HYDROmorphone  (DILAUDID ) injection, ondansetron  (ZOFRAN ) IV, oxyCODONE , promethazine  (PHENERGAN ) injection (IM or IVPB)  Time spent: 40 minutes  Author: ELVAN SOR. MD [facility] 11/22/2023 11:03 AM  To reach On-call, see care teams to locate  the attending and reach out to them via [email or web address]. If 7PM-7AM, please contact night-coverage If you still have difficulty reaching the attending provider, please page the [facility] (Director on Call) for [facility] on amion for assistance.

## 2023-11-22 NOTE — Plan of Care (Signed)

## 2023-11-23 DIAGNOSIS — K8012 Calculus of gallbladder with acute and chronic cholecystitis without obstruction: Secondary | ICD-10-CM | POA: Diagnosis not present

## 2023-11-23 LAB — MAGNESIUM: Magnesium: 1.8 mg/dL (ref 1.7–2.4)

## 2023-11-23 LAB — T4, FREE: Free T4: 1.34 ng/dL — ABNORMAL HIGH (ref 0.61–1.12)

## 2023-11-23 LAB — CBC
HCT: 35.4 % — ABNORMAL LOW (ref 36.0–46.0)
Hemoglobin: 11.4 g/dL — ABNORMAL LOW (ref 12.0–15.0)
MCH: 31.3 pg (ref 26.0–34.0)
MCHC: 32.2 g/dL (ref 30.0–36.0)
MCV: 97.3 fL (ref 80.0–100.0)
Platelets: 203 K/uL (ref 150–400)
RBC: 3.64 MIL/uL — ABNORMAL LOW (ref 3.87–5.11)
RDW: 14.4 % (ref 11.5–15.5)
WBC: 15.7 K/uL — ABNORMAL HIGH (ref 4.0–10.5)
nRBC: 0 % (ref 0.0–0.2)

## 2023-11-23 LAB — BASIC METABOLIC PANEL WITH GFR
Anion gap: 9 (ref 5–15)
BUN: 8 mg/dL (ref 6–20)
CO2: 26 mmol/L (ref 22–32)
Calcium: 7.7 mg/dL — ABNORMAL LOW (ref 8.9–10.3)
Chloride: 105 mmol/L (ref 98–111)
Creatinine, Ser: 0.61 mg/dL (ref 0.44–1.00)
GFR, Estimated: >60 mL/min (ref >60)
Glucose, Bld: 114 mg/dL — ABNORMAL HIGH (ref 70–99)
Potassium: 3.4 mmol/L — ABNORMAL LOW (ref 3.5–5.1)
Sodium: 140 mmol/L (ref 135–145)

## 2023-11-23 LAB — HEPATIC FUNCTION PANEL
ALT: 52 U/L — ABNORMAL HIGH (ref 0–44)
AST: 31 U/L (ref 15–41)
Albumin: 2.6 g/dL — ABNORMAL LOW (ref 3.5–5.0)
Alkaline Phosphatase: 77 U/L (ref 38–126)
Bilirubin, Direct: 0.2 mg/dL (ref 0.0–0.2)
Indirect Bilirubin: 0.6 mg/dL (ref 0.3–0.9)
Total Bilirubin: 0.8 mg/dL (ref 0.0–1.2)
Total Protein: 6.1 g/dL — ABNORMAL LOW (ref 6.5–8.1)

## 2023-11-23 LAB — PHOSPHORUS: Phosphorus: 2.1 mg/dL — ABNORMAL LOW (ref 2.5–4.6)

## 2023-11-23 LAB — TSH: TSH: 1.263 u[IU]/mL (ref 0.350–4.500)

## 2023-11-23 LAB — LIPASE, BLOOD: Lipase: 38 U/L (ref 11–51)

## 2023-11-23 MED ORDER — METOCLOPRAMIDE HCL 5 MG/ML IJ SOLN
5.0000 mg | Freq: Once | INTRAMUSCULAR | Status: AC
Start: 2023-11-23 — End: 2023-11-23
  Administered 2023-11-23: 5 mg via INTRAVENOUS
  Filled 2023-11-23: qty 2

## 2023-11-23 MED ORDER — PANTOPRAZOLE SODIUM 40 MG IV SOLR
40.0000 mg | Freq: Two times a day (BID) | INTRAVENOUS | Status: DC
  Administered 2023-11-23 – 2023-11-25 (×5): 40 mg via INTRAVENOUS
  Filled 2023-11-23 (×5): qty 10

## 2023-11-23 MED ORDER — SODIUM CHLORIDE 0.9 % IV BOLUS
500.0000 mL | Freq: Once | INTRAVENOUS | Status: AC
  Administered 2023-11-23: 500 mL via INTRAVENOUS

## 2023-11-23 MED ORDER — POTASSIUM PHOSPHATES 15 MMOLE/5ML IV SOLN
30.0000 mmol | Freq: Once | INTRAVENOUS | Status: AC
  Administered 2023-11-23: 30 mmol via INTRAVENOUS
  Filled 2023-11-23: qty 10

## 2023-11-23 NOTE — Progress Notes (Signed)
 [facility] Progress Note  Patient: Teresa Rasmussen    FMW:[id number]  DOA: 11/18/2023     Date of Service: the patient was seen and examined on 11/23/2023  Chief Complaint  Patient presents with   Abdominal Pain   Brief hospital course: This is 18 years old female with intellectual disability, Nexplanon in place presented in the [facility] ED with complaining of abdominal pain.  ED workup: VS stable, afebrile BMP: BG 124 elevated Lipase >2800 Alk phos 149, AST 181, ALT 244 elevated.  TP 5.3, direct bili 3.0 CBC: Leukocytosis WBC 17.5 Hepatitis panel negative, EtOH <15, UA negative US  Abd: Cholecystolithiasis with small volume biliary sludge. No changes of acute cholecystitis. MRCP: 1. Findings compatible with acute pancreatitis. No walled-off abscess or pancreatic necrosis. 2. Cholelithiasis without acute cholecystitis. No intra or extrahepatic bile duct dilation. No choledocholithiasis. 3. Mild ascites.   Assessment and Plan:  # Cholelithiasis Continue as needed medication for pain control Continue IV fluid General Surgery consulted 9/25 afebrile, blood cultures ordered, continue empiric antibiotics. 9/26 s/p Robotic assisted laparoscopic cholecystectomy with Indocyamine Green Ductal Imaging.  9/27 patient has any abdominal pain, still has some nausea no vomiting.  BP still soft, patient is feeling little bit dizziness well ambulation.  We will continue to monitor today.  WBC count trending down.  Continue to biotics and plan for discharge tomorrow a.m. 9/28 c/o abdominal pain, nausea no vomiting.  Feels dizzy while walking.  BP soft and tachycardic.  IV fluid NS 40 mL bolus given.   # Acute pancreatitis GI signed off, MRCP negative for CBD stone Continue prn meds for pain control S/p IV fluid for hydration Trend lipase level 2800>>684> 93 Regular diet  # Hypophosphatemia, Phos repleted with Check electrolytes daily and replete as needed.  # Tachycardia and  hypotension could be due to IV pain medication Lactic acid 1.8 negative IV fluid bolus given Continue IV fluid  # Transaminitis secondary to cholelithiasis # Leukocytosis could be reactive versus due to cholelithiasis Procalcitonin negative Continue empiric antibiotics for now Trend LFTs and WBC count  # GERD on PPI  # Vitamin B12 level 289, goal >400.  Started vitamin B12 oral supplement. Follow-up PCP to repeat vitamin B12 level after 3 to 6 months.  # Iron  deficiency, transferrin saturation is 9% Elevated IV iron  due to possibility of acute infection Start oral iron  tablet with vitamin C  and follow with PCP to repeat iron  profile after 3 to 6 months.  # Vitamin D  insufficiency: started vitamin D  50,000 units p.o. weekly, follow with PCP to repeat vitamin D  level after 3 to 6 months.   # Obesity class II Body mass index is 36.14 kg/m.  Interventions:Calorie restricted diet and daily exercise advised to lose body weight.  Lifestyle modification discussed.   Diet: Soft diet DVT Prophylaxis: Subcutaneous Lovenox    Advance goals of care discussion: Full code  Family Communication: family was present at bedside, at the time of interview.  The pt provided permission to discuss medical plan with the family. Opportunity was given to ask question and all questions were answered satisfactorily.   Disposition:  Pt is from Home, admitted with cholelithiasis, pancreatitis and transaminitis, still has intractable pain and electrolyte imbalance, which precludes a safe discharge. Discharge to home, when stable, may need few days to improve.  Subjective: No significant events overnight, patient still has abdominal pain, feels nauseous, no vomiting.  Patient was feeling dizzy while walking and does not feel stable to go home, she would  like to stay another day and discharge plan tomorrow a.m. Patient's mother was at bedside, she agreed with the discharge plan tomorrow a.m.   Physical  Exam: General: NAD, lying comfortably Appear in mild-mod distress due to Abd pain, affect appropriate Eyes: PERRLA ENT: Oral Mucosa Clear, moist  Neck: no JVD,  Cardiovascular: S1 and S2 Present, no Murmur,  Respiratory: good respiratory effort, Bilateral Air entry equal and Decreased, no Crackles, no wheezes Abdomen: BS present, Soft and mild post-op laparoscopic incision areas are tender.  Skin: no rashes Extremities: no Pedal edema, no calf tenderness Neurologic: without any new focal findings Gait not checked due to patient safety concerns  Vitals:   11/23/23 0945 11/23/23 0948 11/23/23 0950 11/23/23 1419  BP: 111/67 (!) 125/57 113/70 108/70  Pulse: (!) 126 (!) 126 (!) 116 (!) 108  Resp: 15 15 15 16   Temp: 99.4 F (37.4 C)   98.2 F (36.8 C)  TempSrc:      SpO2: (!) 86% 93% 96% 96%  Weight:      Height:        Intake/Output Summary (Last 24 hours) at 11/23/2023 1501 Last data filed at 11/23/2023 1300 Gross per 24 hour  Intake 2040 ml  Output --  Net 2040 ml   Filed Weights   11/18/23 1840  Weight: 78.4 kg    Data Reviewed: I have personally reviewed and interpreted daily labs, tele strips, imagings as discussed above. I reviewed all nursing notes, pharmacy notes, vitals, pertinent old records I have discussed plan of care as described above with RN and patient/family.  CBC: Recent Labs  Lab 11/19/23 1012 11/20/23 0224 11/21/23 0538 11/22/23 0317 11/23/23 0825  WBC 19.6* 15.8* 18.8* 14.3* 15.7*  HGB 14.1 12.5 11.1* 10.7* 11.4*  HCT 44.2 39.9 34.7* 33.5* 35.4*  MCV 97.6 99.3 97.7 96.5 97.3  PLT 190 182 162 174 203   Basic Metabolic Panel: Recent Labs  Lab 11/19/23 1012 11/20/23 0224 11/21/23 0538 11/22/23 0317 11/23/23 0825  NA 143 138 139 139 140  K 3.8 3.6 3.6 3.6 3.4*  CL 107 102 106 106 105  CO2 23 26 24 24 26   GLUCOSE 109* 120* 106* 112* 114*  BUN 10 8 5* 10 8  CREATININE 0.73 0.88 0.54 0.53 0.61  CALCIUM 8.7* 8.1* 7.6* 8.1* 7.7*  MG 2.0  2.2 1.8 2.1 1.8  PHOS 2.7 2.4* 2.3* 3.1 2.1*    Studies: No results found.   Scheduled Meds:  ascorbic acid   500 mg Oral Daily   chlorhexidine   15 mL Mouth/Throat Once   vitamin B-12  1,000 mcg Oral Daily   enoxaparin  (LOVENOX ) injection  40 mg Subcutaneous Q24H   iron  polysaccharides  150 mg Oral Daily   pantoprazole  (PROTONIX ) IV  40 mg Intravenous Q12H   sertraline   50 mg Oral Daily   Vitamin D  (Ergocalciferol )  50,000 Units Oral Q7 days   Continuous Infusions:  sodium chloride  100 mL/hr at 11/22/23 0544   piperacillin -tazobactam (ZOSYN )  IV 3.375 g (11/23/23 1044)   potassium PHOSPHATE  IVPB (in mmol) 30 mmol (11/23/23 1148)   promethazine  (PHENERGAN ) injection (IM or IVPB) 12.5 mg (11/23/23 0559)   PRN Meds: acetaminophen , butalbital-acetaminophen -caffeine, docusate sodium , HYDROmorphone  (DILAUDID ) injection, ondansetron  (ZOFRAN ) IV, oxyCODONE , promethazine  (PHENERGAN ) injection (IM or IVPB)  Time spent: 40 minutes  Author: ELVAN SOR. MD [facility] 11/23/2023 3:01 PM  To reach On-call, see care teams to locate the attending and reach out to them via [email or web address]. If 7PM-7AM, please contact  night-coverage If you still have difficulty reaching the attending provider, please page the [facility] (Director on Call) for [facility] on amion for assistance.

## 2023-11-23 NOTE — Plan of Care (Signed)

## 2023-11-23 NOTE — Plan of Care (Signed)

## 2023-11-23 NOTE — Progress Notes (Signed)
 [facility] SURGICAL PROGRESS NOTE  Hospital Day(s): 5.   Post op day(s): 2 Days Post-Op.   Interval History: Patient seen and examined, no acute events or new complaints overnight.  She is tolerating p.o. much better now, denies nausea or vomiting.  Appears much more comfortable.  White blood cell count 15.7 K, uncertain etiology.  Remains somewhat tachycardic, but ambulating to restroom.  Review of Systems:  Constitutional: denies fever, chills  Respiratory: denies any shortness of breath  Cardiovascular: denies chest pain or palpitations  Gastrointestinal: denies uncontrolled abdominal pain, N/V, or diarrhea/and bowel function as per interval history Musculoskeletal: denies pain, decreased motor or sensation Integumentary: denies any other rashes or skin discolorations  Vital signs in last 24 hours: [min-max] current  Temp:  [98.2 F (36.8 C)-99.4 F (37.4 C)] 98.2 F (36.8 C) (09/28 1419) Pulse Rate:  [63-126] 108 (09/28 1419) Resp:  [15-16] 16 (09/28 1419) BP: (99-125)/(57-81) 108/70 (09/28 1419) SpO2:  [86 %-96 %] 96 % (09/28 1419)     Height: 4' 10 (147.3 cm) Weight: 78.4 kg     Intake/Output last 2 shifts:  09/27 0701 - 09/28 0700 In: 1200 [P.O.:1200] Out: -    Physical Exam:  Constitutional: alert, cooperative and no distress  Respiratory: breathing non-labored at rest  Cardiovascular: regular rate and sinus rhythm  Gastrointestinal: soft, non-tender, and non-distended Integumentary: Incisions are clean dry and intact.  Labs:     Latest Ref Rng & Units 11/23/2023    8:25 AM 11/22/2023    3:17 AM 11/21/2023    5:38 AM  CBC  WBC 4.0 - 10.5 K/uL 15.7  14.3  18.8   Hemoglobin 12.0 - 15.0 g/dL 88.5  89.2  88.8   Hematocrit 36.0 - 46.0 % 35.4  33.5  34.7   Platelets 150 - 400 K/uL 203  174  162       Latest Ref Rng & Units 11/23/2023    8:25 AM 11/22/2023    3:17 AM 11/21/2023    5:38 AM  CMP  Glucose 70 - 99 mg/dL 885  887  893   BUN 6 - 20  mg/dL 8  10  5    Creatinine 0.44 - 1.00 mg/dL 9.38  9.46  9.45   Sodium 135 - 145 mmol/L 140  139  139   Potassium 3.5 - 5.1 mmol/L 3.4  3.6  3.6   Chloride 98 - 111 mmol/L 105  106  106   CO2 22 - 32 mmol/L 26  24  24    Calcium 8.9 - 10.3 mg/dL 7.7  8.1  7.6   Total Protein 6.5 - 8.1 g/dL 6.1  7.1  6.4   Total Bilirubin 0.0 - 1.2 mg/dL 0.8  1.0  1.4   Alkaline Phos 38 - 126 U/L 77  95  98   AST 15 - 41 U/L 31  20  23    ALT 0 - 44 U/L 52  64  77   Lipase 38   Imaging studies: No new pertinent imaging studies   Assessment/Plan:  18 y.o. female with gallstone pancreatitis 2 Days Post-Op s/p robotic cholecystectomy for same, complicated by pertinent comorbidities including:  Patient Active Problem List   Diagnosis Date Noted   Abnormal LFTs (liver function tests) 11/20/2023   Gallstone pancreatitis 11/20/2023   Acute biliary pancreatitis without infection or necrosis 11/19/2023   Cholelithiasis 11/18/2023    - Hypokalemia, mild.  - Despite her leukocytosis, I am not convinced antibiotics are helpful,  though uncertain of the source it may be prudent to discharge her with p.o. antibiotics for course.  She remains on Zosyn .   - I am anticipating potential to be discharged tomorrow, safely on oral pain medications, regular diet and outpatient follow-up with us  in 1 to 2 weeks.  - We appreciate your assistance with this young lady.  - We remain available to assist in any way.  All of the above findings and recommendations were discussed with the patient, and all of patient's questions were answered to their expressed satisfaction.  -- Honor Leghorn, M.D., DOYAL 11/23/2023

## 2023-11-24 ENCOUNTER — Inpatient Hospital Stay: Payer: MEDICAID

## 2023-11-24 DIAGNOSIS — K8012 Calculus of gallbladder with acute and chronic cholecystitis without obstruction: Secondary | ICD-10-CM | POA: Diagnosis not present

## 2023-11-24 LAB — BASIC METABOLIC PANEL WITH GFR
Anion gap: 9 (ref 5–15)
Anion gap: 11 (ref 5–15)
BUN: <5 mg/dL — ABNORMAL LOW (ref 6–20)
BUN: 5 mg/dL — ABNORMAL LOW (ref 6–20)
CO2: 27 mmol/L (ref 22–32)
CO2: 28 mmol/L (ref 22–32)
Calcium: 7.9 mg/dL — ABNORMAL LOW (ref 8.9–10.3)
Calcium: 8 mg/dL — ABNORMAL LOW (ref 8.9–10.3)
Chloride: 102 mmol/L (ref 98–111)
Chloride: 100 mmol/L (ref 98–111)
Creatinine, Ser: 0.48 mg/dL (ref 0.44–1.00)
Creatinine, Ser: 0.69 mg/dL (ref 0.44–1.00)
GFR, Estimated: >60 mL/min (ref >60)
GFR, Estimated: >60 mL/min (ref >60)
Glucose, Bld: 110 mg/dL — ABNORMAL HIGH (ref 70–99)
Glucose, Bld: 112 mg/dL — ABNORMAL HIGH (ref 70–99)
Potassium: 3 mmol/L — ABNORMAL LOW (ref 3.5–5.1)
Potassium: 3.3 mmol/L — ABNORMAL LOW (ref 3.5–5.1)
Sodium: 138 mmol/L (ref 135–145)
Sodium: 139 mmol/L (ref 135–145)

## 2023-11-24 LAB — PROCALCITONIN: Procalcitonin: 8.4 ng/mL

## 2023-11-24 LAB — SURGICAL PATHOLOGY

## 2023-11-24 LAB — CBC
HCT: 34.2 % — ABNORMAL LOW (ref 36.0–46.0)
Hemoglobin: 11.2 g/dL — ABNORMAL LOW (ref 12.0–15.0)
MCH: 30.9 pg (ref 26.0–34.0)
MCHC: 32.7 g/dL (ref 30.0–36.0)
MCV: 94.5 fL (ref 80.0–100.0)
Platelets: 218 K/uL (ref 150–400)
RBC: 3.62 MIL/uL — ABNORMAL LOW (ref 3.87–5.11)
RDW: 14.4 % (ref 11.5–15.5)
WBC: 17.1 K/uL — ABNORMAL HIGH (ref 4.0–10.5)
nRBC: 0 % (ref 0.0–0.2)

## 2023-11-24 LAB — HEPATIC FUNCTION PANEL
ALT: 45 U/L — ABNORMAL HIGH (ref 0–44)
AST: 20 U/L (ref 15–41)
Albumin: 2.7 g/dL — ABNORMAL LOW (ref 3.5–5.0)
Alkaline Phosphatase: 82 U/L (ref 38–126)
Bilirubin, Direct: 0.2 mg/dL (ref 0.0–0.2)
Indirect Bilirubin: 0.6 mg/dL (ref 0.3–0.9)
Total Bilirubin: 0.8 mg/dL (ref 0.0–1.2)
Total Protein: 6.5 g/dL (ref 6.5–8.1)

## 2023-11-24 LAB — LIPASE, BLOOD: Lipase: 47 U/L (ref 11–51)

## 2023-11-24 LAB — MAGNESIUM: Magnesium: 1.7 mg/dL (ref 1.7–2.4)

## 2023-11-24 LAB — PREGNANCY, URINE: Preg Test, Ur: NEGATIVE

## 2023-11-24 LAB — PHOSPHORUS: Phosphorus: 3.6 mg/dL (ref 2.5–4.6)

## 2023-11-24 MED ORDER — ACETAMINOPHEN 325 MG PO TABS: 650.0000 mg | ORAL_TABLET | Freq: Four times a day (QID) | ORAL | Status: DC | PRN

## 2023-11-24 MED ORDER — POTASSIUM CHLORIDE 10 MEQ/100ML IV SOLN
10.0000 meq | INTRAVENOUS | Status: AC
  Administered 2023-11-24: 10 meq via INTRAVENOUS

## 2023-11-24 MED ORDER — IOHEXOL 350 MG/ML SOLN
75.0000 mL | Freq: Once | INTRAVENOUS | Status: AC | PRN
Start: 2023-11-24 — End: 2023-11-24
  Administered 2023-11-24: 75 mL via INTRAVENOUS

## 2023-11-24 MED ORDER — ACETAMINOPHEN 325 MG PO TABS
650.0000 mg | ORAL_TABLET | Freq: Four times a day (QID) | ORAL | Status: DC | PRN
  Administered 2023-11-24: 650 mg via ORAL
  Filled 2023-11-24: qty 2

## 2023-11-24 MED ORDER — POTASSIUM CHLORIDE CRYS ER 20 MEQ PO TBCR
40.0000 meq | EXTENDED_RELEASE_TABLET | Freq: Once | ORAL | Status: AC
  Administered 2023-11-24: 40 meq via ORAL
  Filled 2023-11-24: qty 2

## 2023-11-24 MED ORDER — MIDODRINE HCL 5 MG PO TABS
5.0000 mg | ORAL_TABLET | Freq: Three times a day (TID) | ORAL | Status: DC | PRN
  Administered 2023-11-24: 5 mg via ORAL
  Filled 2023-11-24: qty 1

## 2023-11-24 MED ORDER — SACCHAROMYCES BOULARDII 250 MG PO CAPS
250.0000 mg | ORAL_CAPSULE | Freq: Two times a day (BID) | ORAL | Status: DC
  Administered 2023-11-24 – 2023-11-25 (×3): 250 mg via ORAL
  Filled 2023-11-24 (×3): qty 1

## 2023-11-24 MED ORDER — SODIUM CHLORIDE 0.9 % IV BOLUS
500.0000 mL | Freq: Once | INTRAVENOUS | Status: AC
  Administered 2023-11-24: 500 mL via INTRAVENOUS

## 2023-11-24 MED ORDER — POTASSIUM CHLORIDE 10 MEQ/100ML IV SOLN
10.0000 meq | INTRAVENOUS | Status: AC
  Administered 2023-11-24 (×3): 10 meq via INTRAVENOUS
  Filled 2023-11-24 (×2): qty 100

## 2023-11-24 NOTE — Plan of Care (Signed)

## 2023-11-24 NOTE — Discharge Instructions (Addendum)
In addition to included general post-operative instructions,  Diet: Resume home diet. Recommend avoiding or limiting fatty/greasy foods over the next few days/week. If you do eat these, you may (or may not) notice diarrhea. This is expected while your body adjusts to not having a gallbladder, and it typically resolves with time.    Activity: No heavy lifting >20 pounds (children, pets, laundry, garbage) or strenuous activity for 4 weeks, but light activity and walking are encouraged. Do not drive or drink alcohol if taking narcotic pain medications or having pain that might distract from driving.  Wound care: You may shower/get incision wet with soapy water and pat dry (do not rub incisions), but no baths or submerging incision underwater until follow-up.   Medications: Resume all home medications. For mild to moderate pain: acetaminophen (Tylenol) or ibuprofen/naproxen (if no kidney disease). Combining Tylenol with alcohol can substantially increase your risk of causing liver disease. Narcotic pain medications, if prescribed, can be used for severe pain, though may cause nausea, constipation, and drowsiness. Do not combine Tylenol and Percocet (or similar) within a 6 hour period as Percocet (and similar) contain(s) Tylenol. If you do not need the narcotic pain medication, you do not need to fill the prescription.  Call office [phone] / [phone]) at any time if any questions, worsening pain, fevers/chills, bleeding, drainage from incision site, or other concerns.

## 2023-11-24 NOTE — Anesthesia Postprocedure Evaluation (Signed)
 Anesthesia Post Note  Patient: Artha Stavros  Procedure(s) Performed: CHOLECYSTECTOMY, ROBOT-ASSISTED, LAPAROSCOPIC  Patient location during evaluation: PACU Anesthesia Type: General Level of consciousness: awake and alert, oriented and patient cooperative Pain management: pain level controlled Vital Signs Assessment: post-procedure vital signs reviewed and stable Respiratory status: spontaneous breathing, nonlabored ventilation and respiratory function stable Cardiovascular status: blood pressure returned to baseline and stable Postop Assessment: adequate PO intake Anesthetic complications: yes   Encounter Notable Events  Notable Event Outcome Phase Comment  Desaturation < 90% for over 3 min or < 80% for over 1 min  Intraprocedure pre-op O2 saturation 78%; dificult to recruit O2 saturation prior to intubation  Difficult mask airway  Intraprocedure OAW and 2 hande mask required     Last Vitals:  Vitals:   11/24/23 1017 11/24/23 1153  BP: (!) 89/60 117/77  Pulse: (!) 104 89  Resp: 18 18  Temp: 36.8 C   SpO2: 92%     Last Pain:  Vitals:   11/24/23 1425  TempSrc:   PainSc: 4                  Alfonso Ruths

## 2023-11-24 NOTE — Progress Notes (Signed)
  [facility]  Hospital Day(s): 6.   Interval History:  Patient seen and examined No acute events or new complaints overnight. She reports her abdominal pain is better Biggest issue is nausea, decreased appetite No fever, chills, emesis interestingly, her WBC is elevated again today to K Hgb to 11.2; stable Lipase remains normal LFTs are reassuring; AST only mildly elevated at 45 (improving) She is on FLD: she is asking for more options this AM  Vital signs in last 24 hours: [min-max] current  Temp:  [98.2 F (36.8 C)-99.6 F (37.6 C)] 98.5 F (36.9 C) (09/29 0500) Pulse Rate:  [100-126] 100 (09/29 0500) Resp:  [15-19] 18 (09/29 0500) BP: (106-125)/(57-70) 106/64 (09/29 0500) SpO2:  [86 %-97 %] 97 % (09/29 0500)     Height: 4' 10 (147.3 cm) Weight: 78.4 kg     Intake/Output last 2 shifts:  09/28 0701 - 09/29 0700 In: 2040 [P.O.:2040] Out: -    Physical Exam:  Constitutional: alert, cooperative and no distress  HENT: normocephalic without obvious abnormality  Eyes: PERRL, EOM's grossly intact and symmetric  Respiratory: breathing non-labored at rest  Cardiovascular: regular rate and sinus rhythm  Gastrointestinal: soft, she does not appear overtly tender, non-distended, no rebound/guarding.    Labs:     Latest Ref Rng & Units 11/24/2023    3:54 AM 11/23/2023    8:25 AM 11/22/2023    3:17 AM  CBC  WBC 4.0 - 10.5 K/uL 17.1  15.7  14.3   Hemoglobin 12.0 - 15.0 g/dL 88.7  88.5  89.2   Hematocrit 36.0 - 46.0 % 34.2  35.4  33.5   Platelets 150 - 400 K/uL 218  203  174       Latest Ref Rng & Units 11/24/2023    3:54 AM 11/23/2023    8:25 AM 11/22/2023    3:17 AM  CMP  Glucose 70 - 99 mg/dL 889  885  887   BUN 6 - 20 mg/dL 5  8  10    Creatinine 0.44 - 1.00 mg/dL 9.51  9.38  9.46   Sodium 135 - 145 mmol/L 138  140  139   Potassium 3.5 - 5.1 mmol/L 3.0  3.4  3.6   Chloride 98 - 111 mmol/L 102  105  106   CO2 22 - 32 mmol/L 27  26   24    Calcium 8.9 - 10.3 mg/dL 7.9  7.7  8.1   Total Protein 6.5 - 8.1 g/dL 6.5  6.1  7.1   Total Bilirubin 0.0 - 1.2 mg/dL 0.8  0.8  1.0   Alkaline Phos 38 - 126 U/L 82  77  95   AST 15 - 41 U/L 20  31  20    ALT 0 - 44 U/L 45  52  64     Imaging studies: No new pertinent imaging studies   Assessment/Plan:  18 y.o. female with gallstone pancreatitis without choledocholithiasis on MRCP   - We can trail diet advancement. Soft diet. Reviewed recommendation to limit/avoid fatty foods  - Unsure how to explain persistent leukocytosis; She clinically looks to be improve however still remains  intermittently tachycardic. One option to consider if she deteriorates or this does not improve would be to repeat CT to ensure no missed intra-abdominal process. Again, my suspicion for this is low.   - Continue IV Abx (Zosyn ); Given leukocytosis, I would at a minimum DC home with Abx out of caution.  - Monitor  abdominal examination; on-going bowel function - Monitor leukocytosis; worsened - Pain control prn; antiemetics prn   - Mobilize - Further management per primary service   - Discharge Planning: She overall appears to be clinically improving but WBC persists without clear etiology. I will begin diet advancement today. She may be able to DC home vs another 24 hours observation to ensure WBC resolves and does not continue to worsen.   All of the above findings and recommendations were discussed with the patient, her family, and the medical team, and all of patient's questions were answered to her expressed satisfaction.  -- Teresa Platt, PA-C [facility] 11/24/2023, 9:12 AM M-F: 7am - 4pm

## 2023-11-24 NOTE — Addendum Note (Signed)
 Addendum  created 11/24/23 1659 by Liliani Bobo, MD   Intraprocedure Attestations deleted, Intraprocedure Staff edited

## 2023-11-24 NOTE — Progress Notes (Signed)
 [facility] Progress Note  Patient: Teresa Rasmussen    FMW:[id number]  DOA: 11/18/2023     Date of Service: the patient was seen and examined on 11/24/2023  Chief Complaint  Patient presents with   Abdominal Pain   Brief hospital course: This is 18 years old female with intellectual disability, Nexplanon in place presented in the [facility] ED with complaining of abdominal pain.  ED workup: VS stable, afebrile BMP: BG 124 elevated Lipase >2800 Alk phos 149, AST 181, ALT 244 elevated.  TP 5.3, direct bili 3.0 CBC: Leukocytosis WBC 17.5 Hepatitis panel negative, EtOH <15, UA negative US  Abd: Cholecystolithiasis with small volume biliary sludge. No changes of acute cholecystitis. MRCP: 1. Findings compatible with acute pancreatitis. No walled-off abscess or pancreatic necrosis. 2. Cholelithiasis without acute cholecystitis. No intra or extrahepatic bile duct dilation. No choledocholithiasis. 3. Mild ascites.   Assessment and Plan:  # Cholelithiasis Continue as needed medication for pain control Continue IV fluid General Surgery consulted 9/25 afebrile, blood cultures ordered, continue empiric antibiotics. 9/26 s/p Robotic assisted laparoscopic cholecystectomy with Indocyamine Green Ductal Imaging.  9/27 patient has any abdominal pain, still has some nausea no vomiting.  BP still soft, patient is feeling little bit dizziness well ambulation.  We will continue to monitor today.  WBC count trending down.  Continue to biotics and plan for discharge tomorrow a.m. 9/28 c/o abdominal pain, nausea no vomiting.  Feels dizzy while walking.  BP soft and tachycardic.  IV fluid NS 40 mL bolus given. 9/29 hypotensive, NS bolus given, started midodrine 5 mg p.o. TID prn  9/29 US  abdomen: Small amount of ascites, and small right pleural effusion. 4.8 cm simple appearing fluid collection in right abdomen adjacent to the kidney. D/w general surgery, recommended CT chest/AP with IV contrast.   #  Acute pancreatitis GI signed off, MRCP negative for CBD stone Continue prn meds for pain control S/p IV fluid for hydration Trend lipase level 2800>>684> 93 Regular diet  # Hypokalemia, potassium repleted. # Hypophosphatemia, Phos repleted with Check electrolytes daily and replete as needed.  # Tachycardia and hypotension could be due to IV pain medication Lactic acid 1.8 negative IV fluid bolus given Continue IV fluid  # Transaminitis secondary to cholelithiasis # Leukocytosis could be reactive versus due to cholelithiasis Procalcitonin negative Continue empiric antibiotics for now Trend LFTs and WBC count 9/29 WBC 17.1 elevated, Pro-Cal 8.4 elevated Continue current antibiotics and follow CT scan.  # GERD on PPI  # Vitamin B12 level 289, goal >400.  Started vitamin B12 oral supplement. Follow-up PCP to repeat vitamin B12 level after 3 to 6 months.  # Iron  deficiency, transferrin saturation is 9% Elevated IV iron  due to possibility of acute infection Start oral iron  tablet with vitamin C  and follow with PCP to repeat iron  profile after 3 to 6 months.  # Vitamin D  insufficiency: started vitamin D  50,000 units p.o. weekly, follow with PCP to repeat vitamin D  level after 3 to 6 months.   # Obesity class II Body mass index is 36.14 kg/m.  Interventions:Calorie restricted diet and daily exercise advised to lose body weight.  Lifestyle modification discussed.   Diet: Soft diet DVT Prophylaxis: Subcutaneous Lovenox    Advance goals of care discussion: Full code  Family Communication: family was present at bedside, at the time of interview.  The pt provided permission to discuss medical plan with the family. Opportunity was given to ask question and all questions were answered satisfactorily.   Disposition:  Pt is from Home, admitted with cholelithiasis, pancreatitis and transaminitis, still has intractable pain and electrolyte imbalance, which precludes a safe  discharge. Discharge to home, when stable, may need few days to improve.  Subjective: No significant events overnight, patient was having headache in the morning and does not feel good, slightly lightheaded denied any abdominal pain. Patient had an episode of diarrhea last night, denied any nausea vomiting.  No chest pain or palpitation, no shortness of breath.   Physical Exam: General: NAD, lying comfortably Appear in mild-mod distress due to Abd pain, affect appropriate Eyes: PERRLA ENT: Oral Mucosa Clear, moist  Neck: no JVD,  Cardiovascular: S1 and S2 Present, no Murmur,  Respiratory: good respiratory effort, Bilateral Air entry equal and Decreased, no Crackles, no wheezes Abdomen: BS present, Soft and post-op laparoscopic incision areas are tender.  Chane Skin: no rashes Extremities: no Pedal edema, no calf tenderness Neurologic: without any new focal findings Gait not checked due to patient safety concerns  Vitals:   11/23/23 2003 11/24/23 0500 11/24/23 1017 11/24/23 1153  BP: 119/68 106/64 (!) 89/60 117/77  Pulse: (!) 104 100 (!) 104 89  Resp: 19 18 18 18   Temp: 98.7 F (37.1 C) 98.5 F (36.9 C) 98.2 F (36.8 C)   TempSrc: Oral Oral Oral   SpO2: 95% 97% 92%   Weight:      Height:        Intake/Output Summary (Last 24 hours) at 11/24/2023 1518 Last data filed at 11/24/2023 1300 Gross per 24 hour  Intake 720 ml  Output --  Net 720 ml   Filed Weights   11/18/23 1840  Weight: 78.4 kg    Data Reviewed: I have personally reviewed and interpreted daily labs, tele strips, imagings as discussed above. I reviewed all nursing notes, pharmacy notes, vitals, pertinent old records I have discussed plan of care as described above with RN and patient/family.  CBC: Recent Labs  Lab 11/20/23 0224 11/21/23 0538 11/22/23 0317 11/23/23 0825 11/24/23 0354  WBC 15.8* 18.8* 14.3* 15.7* 17.1*  HGB 12.5 11.1* 10.7* 11.4* 11.2*  HCT 39.9 34.7* 33.5* 35.4* 34.2*  MCV 99.3  97.7 96.5 97.3 94.5  PLT 182 162 174 203 218   Basic Metabolic Panel: Recent Labs  Lab 11/20/23 0224 11/21/23 0538 11/22/23 0317 11/23/23 0825 11/24/23 0354 11/24/23 1221  NA 138 139 139 140 138 139  K 3.6 3.6 3.6 3.4* 3.0* 3.3*  CL 102 106 106 105 102 100  CO2 26 24 24 26 27 28   GLUCOSE 120* 106* 112* 114* 110* 112*  BUN 8 5* 10 8 <5* 5*  CREATININE 0.88 0.54 0.53 0.61 0.48 0.69  CALCIUM 8.1* 7.6* 8.1* 7.7* 7.9* 8.0*  MG 2.2 1.8 2.1 1.8 1.7  --   PHOS 2.4* 2.3* 3.1 2.1* 3.6  --     Studies: US  Abdomen Limited Result Date: 11/24/2023 CLINICAL DATA:  Recent cholecystectomy.  Leukocytosis. EXAM: LIMITED ABDOMEN ULTRASOUND TECHNIQUE: Limited ultrasound survey was performed in all four abdominal quadrants. COMPARISON:  None Available. FINDINGS: Small amount of free fluid seen in both upper quadrants. Small right pleural effusion also seen. A simple appearing fluid collection is seen in the right abdomen adjacent to the kidney which measures 4.8 x 3.1 cm. IMPRESSION: Small amount of ascites in both upper quadrants. Small right pleural effusion. 4.8 cm simple appearing fluid collection in right abdomen adjacent to the kidney. Electronically Signed   By: Norleen DELENA Kil M.D.   On: 11/24/2023 12:01  Scheduled Meds:  ascorbic acid   500 mg Oral Daily   chlorhexidine   15 mL Mouth/Throat Once   vitamin B-12  1,000 mcg Oral Daily   enoxaparin  (LOVENOX ) injection  40 mg Subcutaneous Q24H   iron  polysaccharides  150 mg Oral Daily   pantoprazole  (PROTONIX ) IV  40 mg Intravenous Q12H   saccharomyces boulardii  250 mg Oral BID   sertraline   50 mg Oral Daily   Vitamin D  (Ergocalciferol )  50,000 Units Oral Q7 days   Continuous Infusions:  piperacillin -tazobactam (ZOSYN )  IV 3.375 g (11/24/23 1016)   potassium chloride 10 mEq (11/24/23 1430)   promethazine  (PHENERGAN ) injection (IM or IVPB) 12.5 mg (11/23/23 0559)   PRN Meds: acetaminophen , butalbital-acetaminophen -caffeine, docusate sodium ,  HYDROmorphone  (DILAUDID ) injection, midodrine, ondansetron  (ZOFRAN ) IV, oxyCODONE , promethazine  (PHENERGAN ) injection (IM or IVPB)  Time spent: 55 minutes  Author: ELVAN SOR. MD [facility] 11/24/2023 3:18 PM  To reach On-call, see care teams to locate the attending and reach out to them via [email or web address]. If 7PM-7AM, please contact night-coverage If you still have difficulty reaching the attending provider, please page the [facility] (Director on Call) for [facility] on amion for assistance.

## 2023-11-25 ENCOUNTER — Other Ambulatory Visit: Payer: Self-pay

## 2023-11-25 DIAGNOSIS — K8012 Calculus of gallbladder with acute and chronic cholecystitis without obstruction: Secondary | ICD-10-CM | POA: Diagnosis not present

## 2023-11-25 LAB — BASIC METABOLIC PANEL WITH GFR
Anion gap: 10 (ref 5–15)
BUN: <5 mg/dL — ABNORMAL LOW (ref 6–20)
CO2: 27 mmol/L (ref 22–32)
Calcium: 8.6 mg/dL — ABNORMAL LOW (ref 8.9–10.3)
Chloride: 102 mmol/L (ref 98–111)
Creatinine, Ser: 0.81 mg/dL (ref 0.44–1.00)
GFR, Estimated: >60 mL/min (ref >60)
Glucose, Bld: 90 mg/dL (ref 70–99)
Potassium: 4.2 mmol/L (ref 3.5–5.1)
Sodium: 139 mmol/L (ref 135–145)

## 2023-11-25 LAB — CULTURE, BLOOD (ROUTINE X 2)
Culture: NO GROWTH
Culture: NO GROWTH

## 2023-11-25 LAB — MAGNESIUM: Magnesium: 2 mg/dL (ref 1.7–2.4)

## 2023-11-25 LAB — CBC
HCT: 34.2 % — ABNORMAL LOW (ref 36.0–46.0)
Hemoglobin: 10.8 g/dL — ABNORMAL LOW (ref 12.0–15.0)
MCH: 30.7 pg (ref 26.0–34.0)
MCHC: 31.6 g/dL (ref 30.0–36.0)
MCV: 97.2 fL (ref 80.0–100.0)
Platelets: 211 K/uL (ref 150–400)
RBC: 3.52 MIL/uL — ABNORMAL LOW (ref 3.87–5.11)
RDW: 14.5 % (ref 11.5–15.5)
WBC: 14.9 K/uL — ABNORMAL HIGH (ref 4.0–10.5)
nRBC: 0 % (ref 0.0–0.2)

## 2023-11-25 LAB — PHOSPHORUS: Phosphorus: 3.6 mg/dL (ref 2.5–4.6)

## 2023-11-25 MED ORDER — CYANOCOBALAMIN 1000 MCG PO TABS
1000.0000 ug | ORAL_TABLET | Freq: Every day | ORAL | 2 refills | Status: AC
  Filled 2023-11-25: qty 30, 30d supply, fill #0

## 2023-11-25 MED ORDER — ASCORBIC ACID 500 MG PO TABS
500.0000 mg | ORAL_TABLET | Freq: Every day | ORAL | 2 refills | Status: DC
  Filled 2023-11-25: qty 30, 30d supply, fill #0

## 2023-11-25 MED ORDER — VITAMIN D (ERGOCALCIFEROL) 1.25 MG (50000 UNIT) PO CAPS
50000.0000 [IU] | ORAL_CAPSULE | ORAL | 0 refills | Status: AC
  Filled 2023-11-25: qty 12, 84d supply, fill #0

## 2023-11-25 MED ORDER — POLYSACCHARIDE IRON COMPLEX 150 MG PO CAPS
150.0000 mg | ORAL_CAPSULE | Freq: Every day | ORAL | 2 refills | Status: DC
  Filled 2023-11-25: qty 30, 30d supply, fill #0

## 2023-11-25 MED ORDER — ACETAMINOPHEN 325 MG PO TABS: 325.0000 mg | ORAL_TABLET | Freq: Three times a day (TID) | ORAL | Status: DC | PRN

## 2023-11-25 NOTE — Discharge Summary (Signed)
 [facility] Discharge Summary   Patient: Teresa Rasmussen FMW:[id number]  PCP: [facility]  Date of admission: 11/18/2023   Date of discharge:  11/25/2023     Discharge Diagnoses:  Principal Problem:   Cholelithiasis Active Problems:   Acute biliary pancreatitis without infection or necrosis   Abnormal LFTs (liver function tests)   Gallstone pancreatitis   Admitted From: Home Disposition:  Home   Recommendations for Outpatient Follow-up:  Follow-up with PCP in 1 week Continue to monitor BP at home and follow with PCP. Follow-up with general surgery in 1 week Follow up LABS/TEST:  Repeat CBC, BMP in 1 to 2 weeks Repeat vitamin B12 level and vitamin D  level, iron  profile in 3 to 6 months    Follow-up Information     Schulz, Zachary R, PA-C. Schedule an appointment as soon as possible for a visit in 2 day(s).   Specialty: Physician Assistant Why: s/p cholecystectomy Contact information: [address] [place] [address] [phone]         [facility] Follow up in 1 week(s).   Specialty: General Practice Contact information: [address] [place] [address] [phone]                Diet recommendation: Regular diet  Activity: The patient is advised to gradually reintroduce usual activities, as tolerated  Discharge Condition: stable  Code Status: Full code   History of present illness: As per the H and P dictated on admission.  Hospital Course:  This is 18 years old female with intellectual disability, Nexplanon in place presented in the [facility] ED with complaining of abdominal pain.  She was found to have acute pancreatitis and cholelithiasis.   ED workup: VS stable, afebrile BMP: BG 124 elevated Lipase >2800 Alk phos 149, AST 181, ALT 244 elevated.  TP 5.3, direct bili 3.0 CBC: Leukocytosis WBC 17.5 Hepatitis panel negative, EtOH <15, UA negative US  Abd:  Cholecystolithiasis with small volume biliary sludge. No changes of acute cholecystitis. MRCP: 1. Findings compatible with acute pancreatitis. No walled-off abscess or pancreatic necrosis. 2. Cholelithiasis without acute cholecystitis. No intra or extrahepatic bile duct dilation. No choledocholithiasis. 3. Mild ascites.     Assessment and Plan:   # Cholelithiasis S/p prn medication for pain control and IV fluid General Surgery consulted 9/25 afebrile, blood cultures ordered, continue empiric antibiotics. 9/26 s/p Robotic assisted laparoscopic cholecystectomy with Indocyamine Green Ductal Imaging.  9/27 patient has any abdominal pain, still has some nausea no vomiting.  BP still soft, patient is feeling little bit dizziness well ambulation.  We will continue to monitor today.  WBC count trending down.  Continue antibiotics and plan for discharge tomorrow a.m. 9/28 c/o abdominal pain and nausea, but no vomiting.  Feels dizzy while walking.  BP soft and tachycardic.  IV fluid NS 500 mL bolus given. 9/29 hypotensive, NS bolus given, started midodrine 5 mg p.o. TID prn  9/29 US  abdomen: Small amount of ascites, and small right pleural effusion. 4.8 cm simple appearing fluid collection in right abdomen adjacent to the kidney. D/w general surgery, recommended CT chest/AP with IV contrast. 9/30  CT c/a/p: Acute pancreatitis similar to prior MRI, reactive duodenitis secondary to pancreatitis.  Small bilateral pleural effusion with lower lobe atelectasis.  Postoperative subcutaneous and intramuscular gas in the left abdominal wall related to recent cholecystectomy. Today patient is feeling much better, denied any headache or dizziness, no abdominal pain, no nausea vomiting tolerated diet  well.  Patient feels comfortable going home today.  Cleared by general surgery to discharge and follow-up as an outpatient.    # Acute pancreatitis: GI signed off, MRCP negative for CBD stone Continue prn meds for pain  control. S/p IV fluid for hydration Trend lipase level 2800>>684> 93, on Regular diet   # Hypokalemia, potassium repleted.  Resolved # Hypophosphatemia, Phos repleted.  Resolved  # Tachycardia and hypotension could be due to IV pain medication. Lactic acid 1.8 negative. S/p multiple IV fluid bolus given for few days.  Currently VS stable and asymptomatic.   # Transaminitis secondary to cholelithiasis # Leukocytosis: Most likely reactive due to cholelithiasis and pancreatitis.  Prophylactic antibiotics were continued during hospital stay.  Procalcitonin initially was negative but significant positive on 9/28.  Patient did have persistent leukocytosis but she was covered with antibiotics during hospital stay.  As per general surgery patient does not need antibiotics on discharge and follow-up as an outpatient.   # GERD on PPI # Vitamin B12 level 289, goal >400.  Started vitamin B12 oral supplement. Follow-up PCP to repeat vitamin B12 level after 3 to 6 months.   # Iron  deficiency, transferrin saturation is 9% Elevated IV iron  due to possibility of acute infection Start oral iron  tablet with vitamin C  and follow with PCP to repeat iron  profile after 3 to 6 months.   # Vitamin D  insufficiency: started vitamin D  50,000 units p.o. weekly, follow with PCP to repeat vitamin D  level after 3 to 6 months.   # Obesity class II Body mass index is 36.14 kg/m.  Interventions:Calorie restricted diet and daily exercise advised to lose body weight.  Lifestyle modification discussed.  Patient was ambulatory without any assistance. On the day of the discharge the patient's vitals were stable, and no other acute medical condition were reported by patient. the patient was felt safe to be discharge at Home.  Consultants: General surgery and GI Procedures: S/p lap chole  Discharge Exam: General: Appear in no distress, Oral Mucosa Clear, moist. Cardiovascular: S1 and S2 Present, no Murmur, Respiratory:  normal respiratory effort, Bilateral Air entry present and no Crackles, no wheezes Abdomen: Bowel Sound present, Soft and no tenderness. Extremities: no Pedal edema, no calf tenderness Neurology: alert and oriented to time, place, and person affect appropriate.  Filed Weights   11/18/23 1840  Weight: 78.4 kg   Vitals:   11/25/23 0454 11/25/23 0735  BP: 112/73 100/81  Pulse: 93 75  Resp: 19 17  Temp: 97.6 F (36.4 C) 98.2 F (36.8 C)  SpO2: 95% 96%    DISCHARGE MEDICATION: Allergies as of 11/25/2023   No Known Allergies      Medication List     TAKE these medications    acetaminophen  325 MG tablet Commonly known as: TYLENOL  Take 1-2 tablets (325-650 mg total) by mouth 3 (three) times daily as needed for mild pain (pain score 1-3), fever, headache or moderate pain (pain score 4-6).   ascorbic acid  500 MG tablet Commonly known as: VITAMIN C  Take 1 tablet (500 mg total) by mouth daily. Start taking on: November 26, 2023   cyanocobalamin  1000 MCG tablet Take 1 tablet (1,000 mcg total) by mouth daily. Start taking on: November 26, 2023   iron  polysaccharides 150 MG capsule Commonly known as: NIFEREX Take 1 capsule (150 mg total) by mouth daily. Start taking on: November 26, 2023   MiraLax 17 GM/SCOOP powder Generic drug: polyethylene glycol powder Take 17 g by mouth daily.  omeprazole 20 MG capsule Commonly known as: PRILOSEC Take 20 mg by mouth 2 (two) times daily.   sertraline  50 MG tablet Commonly known as: ZOLOFT  Take 50 mg by mouth daily.   Vitamin D  (Ergocalciferol ) 1.25 MG (50000 UNIT) Caps capsule Commonly known as: DRISDOL  Take 1 capsule (50,000 Units total) by mouth every 7 (seven) days. Start taking on: November 28, 2023       No Known Allergies Discharge Instructions     Call MD for:  difficulty breathing, headache or visual disturbances   Complete by: As directed    Call MD for:  extreme fatigue   Complete by: As directed    Call MD for:   persistant dizziness or light-headedness   Complete by: As directed    Call MD for:  persistant nausea and vomiting   Complete by: As directed    Call MD for:  redness, tenderness, or signs of infection (pain, swelling, redness, odor or green/yellow discharge around incision site)   Complete by: As directed    Call MD for:  severe uncontrolled pain   Complete by: As directed    Call MD for:  temperature >100.4   Complete by: As directed    Diet general   Complete by: As directed    Discharge instructions   Complete by: As directed    Follow-up with PCP in 1 week Continue to monitor BP at home and follow with PCP. Repeat CBC, BMP in 1 to 2 weeks Repeat vitamin B12 level and vitamin D  level, iron  profile in 3 to 6 months Follow-up with general surgery in 1 week   Increase activity slowly   Complete by: As directed    No wound care   Complete by: As directed        The results of significant diagnostics from this hospitalization (including imaging, microbiology, ancillary and laboratory) are listed below for reference.    Significant Diagnostic Studies: CT CHEST ABDOMEN PELVIS W CONTRAST Result Date: 11/24/2023 EXAM: CT CHEST, ABDOMEN AND PELVIS WITH CONTRAST 11/24/2023 08:02:50 PM TECHNIQUE: CT of the chest, abdomen and pelvis was performed with the administration of 75 mL of iohexol  (OMNIPAQUE ) 350 MG/ML injection. Multiplanar reformatted images are provided for review. Automated exposure control, iterative reconstruction, and/or weight based adjustment of the mA/kV was utilized to reduce the radiation dose to as low as reasonably achievable. Per MD no oral contrast. COMPARISON: CT abdomen and pelvis completed on 11/14/2015 and MRI abdomen 11/19/2023. CLINICAL HISTORY: Sepsis. Sepsis; Per MD no oral contrast.; Gallbladder surgery last week. FINDINGS: CHEST: MEDIASTINUM AND LYMPH NODES: Heart and pericardium are unremarkable. The central airways are clear. No mediastinal, hilar or  axillary lymphadenopathy. LUNGS AND PLEURA: Small bilateral pleural effusions. Associated atelectasis in the lower lobes. No pneumothorax. ABDOMEN AND PELVIS: LIVER: The liver is unremarkable. GALLBLADDER AND BILE DUCTS: Status post cholecystectomy. No biliary ductal dilatation. SPLEEN: No acute abnormality. PANCREAS: Extensive fluid and stranding about the pancreas compatible with acute pancreatitis. This is grossly similar to MRI 11/18/2021, given differences in technique. No pancreatic ductal dilation. No evidence of pancreatic necrosis. No organized fluid collection. ADRENAL GLANDS: No acute abnormality. KIDNEYS, URETERS AND BLADDER: No stones in the kidneys or ureters. No hydronephrosis. No perinephric or periureteral stranding. Urinary bladder is unremarkable. GI AND BOWEL: Reactive inflammation of the duodenum. Stomach demonstrates no acute abnormality. There is no bowel obstruction. REPRODUCTIVE ORGANS: No acute abnormality. PERITONEUM AND RETROPERITONEUM: Free fluid tracks inferiorly in the anterior renal space. No free intraperitoneal air.  VASCULATURE: Aorta is normal in caliber. ABDOMINAL AND PELVIS LYMPH NODES: No lymphadenopathy. REPRODUCTIVE ORGANS: No acute abnormality. BONES AND SOFT TISSUES: Subcutaneous and intramuscular gas in the left abdominal wall related to recent cholecystectomy. No acute osseous abnormality. IMPRESSION: 1. Acute pancreatitis, grossly similar to prior MRI. 2. Reactive duodenal inflammation secondary to pancreatitis. 3. Small bilateral pleural effusions with associated lower lobe atelectasis. Pneumonia is difficult to exclude. 4. Postoperative subcutaneous and intramuscular gas in the left abdominal wall related to recent cholecystectomy. Electronically signed by: Norman Gatlin MD 11/24/2023 08:14 PM EDT RP Workstation: [id number]   US  Abdomen Limited Result Date: 11/24/2023 CLINICAL DATA:  Recent cholecystectomy.  Leukocytosis. EXAM: LIMITED ABDOMEN ULTRASOUND TECHNIQUE:  Limited ultrasound survey was performed in all four abdominal quadrants. COMPARISON:  None Available. FINDINGS: Small amount of free fluid seen in both upper quadrants. Small right pleural effusion also seen. A simple appearing fluid collection is seen in the right abdomen adjacent to the kidney which measures 4.8 x 3.1 cm. IMPRESSION: Small amount of ascites in both upper quadrants. Small right pleural effusion. 4.8 cm simple appearing fluid collection in right abdomen adjacent to the kidney. Electronically Signed   By: Norleen DELENA Kil M.D.   On: 11/24/2023 12:01   MR 3D Recon At Scanner Result Date: 11/19/2023 CLINICAL DATA:  Cholelithiasis. Follow-up MRCP inpatient with cholelithiasis. EXAM: MRI ABDOMEN WITHOUT AND WITH CONTRAST (INCLUDING MRCP) TECHNIQUE: Multiplanar multisequence MR imaging of the abdomen was performed both before and after the administration of intravenous contrast. Heavily T2-weighted images of the biliary and pancreatic ducts were obtained, and three-dimensional MRCP images were rendered by post processing. CONTRAST:  7mL GADAVIST  GADOBUTROL  1 MMOL/ML IV SOLN COMPARISON:  Ultrasound abdomen from 11/18/2023. FINDINGS: Lower chest: Unremarkable MR appearance to the lung bases. No pleural effusion. No pericardial effusion. Normal heart size. Hepatobiliary: The liver is normal in size. Noncirrhotic configuration. No focal mass. No intrahepatic or extrahepatic bile duct dilatation. No choledocholithiasis. Physiologically distended gallbladder. Small-to-moderate volume dependent gallstones and sludge. No abnormal wall thickening. There is trace pericholecystic fat stranding and fluid near the neck, which is likely secondary to pancreatitis described below. Pancreas: There is bulky and slightly heterogeneous pancreas exhibiting moderate peripancreatic fat stranding and fluid which extends along the bilateral paracolic gutters. No walled-off abscess. There is normal inhomogeneous enhancement of the  entire pancreas, thereby excluding pancreatic necrosis. No suspicious pancreatic lesion. Main pancreatic duct is not dilated. The splenic vein, superior mesenteric vein and portal veins are patent and without significant stenosis. Spleen:  Within normal limits in size and appearance. No focal mass. Adrenals/Urinary Tract: Unremarkable adrenal glands. No hydroureteronephrosis. No suspicious renal mass. Stomach/Bowel: Visualized portions within the abdomen are unremarkable. No disproportionate dilation of bowel loops. Vascular/Lymphatic: No pathologically enlarged lymph nodes identified. No abdominal aortic aneurysm demonstrated. There is mild ascites. Other: Limited evaluation of uterus and bilateral ovaries on coronal images, which appears grossly within normal limits. Musculoskeletal: No suspicious bone lesions identified. IMPRESSION: 1. Findings compatible with acute pancreatitis. No walled-off abscess or pancreatic necrosis. 2. Cholelithiasis without acute cholecystitis. No intra or extrahepatic bile duct dilation. No choledocholithiasis. 3. Mild ascites. Electronically Signed   By: Ree Molt M.D.   On: 11/19/2023 11:20   MR ABDOMEN MRCP W WO CONTAST Result Date: 11/19/2023 CLINICAL DATA:  Cholelithiasis. Follow-up MRCP inpatient with cholelithiasis. EXAM: MRI ABDOMEN WITHOUT AND WITH CONTRAST (INCLUDING MRCP) TECHNIQUE: Multiplanar multisequence MR imaging of the abdomen was performed both before and after the administration of intravenous contrast. Heavily T2-weighted images  of the biliary and pancreatic ducts were obtained, and three-dimensional MRCP images were rendered by post processing. CONTRAST:  7mL GADAVIST  GADOBUTROL  1 MMOL/ML IV SOLN COMPARISON:  Ultrasound abdomen from 11/18/2023. FINDINGS: Lower chest: Unremarkable MR appearance to the lung bases. No pleural effusion. No pericardial effusion. Normal heart size. Hepatobiliary: The liver is normal in size. Noncirrhotic configuration. No focal  mass. No intrahepatic or extrahepatic bile duct dilatation. No choledocholithiasis. Physiologically distended gallbladder. Small-to-moderate volume dependent gallstones and sludge. No abnormal wall thickening. There is trace pericholecystic fat stranding and fluid near the neck, which is likely secondary to pancreatitis described below. Pancreas: There is bulky and slightly heterogeneous pancreas exhibiting moderate peripancreatic fat stranding and fluid which extends along the bilateral paracolic gutters. No walled-off abscess. There is normal inhomogeneous enhancement of the entire pancreas, thereby excluding pancreatic necrosis. No suspicious pancreatic lesion. Main pancreatic duct is not dilated. The splenic vein, superior mesenteric vein and portal veins are patent and without significant stenosis. Spleen:  Within normal limits in size and appearance. No focal mass. Adrenals/Urinary Tract: Unremarkable adrenal glands. No hydroureteronephrosis. No suspicious renal mass. Stomach/Bowel: Visualized portions within the abdomen are unremarkable. No disproportionate dilation of bowel loops. Vascular/Lymphatic: No pathologically enlarged lymph nodes identified. No abdominal aortic aneurysm demonstrated. There is mild ascites. Other: Limited evaluation of uterus and bilateral ovaries on coronal images, which appears grossly within normal limits. Musculoskeletal: No suspicious bone lesions identified. IMPRESSION: 1. Findings compatible with acute pancreatitis. No walled-off abscess or pancreatic necrosis. 2. Cholelithiasis without acute cholecystitis. No intra or extrahepatic bile duct dilation. No choledocholithiasis. 3. Mild ascites. Electronically Signed   By: Ree Molt M.D.   On: 11/19/2023 08:58   US  ABDOMEN LIMITED RUQ (LIVER/GB) Result Date: 11/18/2023 CLINICAL DATA:  RUQ pain, elevated LFTs EXAM: ULTRASOUND ABDOMEN LIMITED RIGHT UPPER QUADRANT COMPARISON:  04/20/2018 FINDINGS: Gallbladder: Small volume  biliary sludge with a few small gallstones. No wall thickening or pericholecystic fluid. No sonographic Murphy's sign noted by sonographer. Common bile duct: Diameter: 4 mm Liver: Normal echogenicity. No focal lesion identified. No intrahepatic biliary ductal dilation. Portal vein is patent on color Doppler imaging with normal direction of blood flow towards the liver. Other: None. IMPRESSION: Cholecystolithiasis with small volume biliary sludge. No changes of acute cholecystitis. Electronically Signed   By: Rogelia Myers M.D.   On: 11/18/2023 17:25    Microbiology: Recent Results (from the past 240 hours)  Culture, blood (Routine X 2) w Reflex to ID Panel     Status: None   Collection Time: 11/20/23  9:29 AM   Specimen: BLOOD  Result Value Ref Range Status   Specimen Description BLOOD BLOOD LEFT ARM  Final   Special Requests   Final    BOTTLES DRAWN AEROBIC AND ANAEROBIC Blood Culture results may not be optimal due to an inadequate volume of blood received in culture bottles   Culture   Final    NO GROWTH 5 DAYS Performed at [facility], [address] [place], [place] [address]    Report Status 11/25/2023 FINAL  Final  Culture, blood (Routine X 2) w Reflex to ID Panel     Status: None   Collection Time: 11/20/23  9:33 AM   Specimen: BLOOD  Result Value Ref Range Status   Specimen Description BLOOD BLOOD LEFT HAND  Final   Special Requests   Final    BOTTLES DRAWN AEROBIC AND ANAEROBIC Blood Culture results may not be optimal due to an inadequate volume of blood  received in culture bottles   Culture   Final    NO GROWTH 5 DAYS Performed at [facility], [address] [place], [place] [address]    Report Status 11/25/2023 FINAL  Final     Labs: CBC: Recent Labs  Lab 11/21/23 0538 11/22/23 0317 11/23/23 0825 11/24/23 0354 11/25/23 0542  WBC 18.8* 14.3* 15.7* 17.1* 14.9*  HGB 11.1* 10.7* 11.4* 11.2* 10.8*  HCT 34.7* 33.5* 35.4* 34.2* 34.2*  MCV 97.7  96.5 97.3 94.5 97.2  PLT 162 174 203 218 211   Basic Metabolic Panel: Recent Labs  Lab 11/21/23 0538 11/22/23 0317 11/23/23 0825 11/24/23 0354 11/24/23 1221 11/25/23 0542  NA 139 139 140 138 139 139  K 3.6 3.6 3.4* 3.0* 3.3* 4.2  CL 106 106 105 102 100 102  CO2 24 24 26 27 28 27   GLUCOSE 106* 112* 114* 110* 112* 90  BUN 5* 10 8 <5* 5* <5*  CREATININE 0.54 0.53 0.61 0.48 0.69 0.81  CALCIUM 7.6* 8.1* 7.7* 7.9* 8.0* 8.6*  MG 1.8 2.1 1.8 1.7  --  2.0  PHOS 2.3* 3.1 2.1* 3.6  --  3.6   Liver Function Tests: Recent Labs  Lab 11/20/23 0224 11/21/23 0538 11/22/23 0317 11/23/23 0825 11/24/23 0354  AST 48* 23 20 31 20   ALT 124* 77* 64* 52* 45*  ALKPHOS 129* 98 95 77 82  BILITOT 1.5* 1.4* 1.0 0.8 0.8  PROT 7.2 6.4* 7.1 6.1* 6.5  ALBUMIN 3.5 2.9* 3.1* 2.6* 2.7*   Recent Labs  Lab 11/20/23 0224 11/21/23 0538 11/22/23 0317 11/23/23 0825 11/24/23 0354  LIPASE 684* 93* 35 38 47   No results for input(s): AMMONIA in the last 168 hours. Cardiac Enzymes: No results for input(s): CKTOTAL, CKMB, CKMBINDEX, TROPONINI in the last 168 hours. BNP (last 3 results) No results for input(s): BNP in the last 8760 hours. CBG: No results for input(s): GLUCAP in the last 168 hours.  Time spent: 35 minutes  Signed:  Elvan Sor  [facility] 11/25/2023 2:46 PM

## 2023-11-25 NOTE — Progress Notes (Signed)
 [facility] SURGICAL PROGRESS NOTE  Hospital Day(s): 7.   Interval History:  Patient seen and examined No acute events or new complaints overnight. She feels better; looks brightest I have seen Still with upper abdominal soreness; not surprising  Intermittent nausea  Her leukocytosis is improved now; WBC 14.9K Hgb to 10.8 Renal function remains normal; sCr - 0.81; UO - unmeasured  No electrolyte derangements She did have CT C/A/P yesterday concerning for persistent pancreatitis  She is on soft diet; tolerating small amounts Zosyn   Vital signs in last 24 hours: [min-max] current  Temp:  [97.6 F (36.4 C)-99 F (37.2 C)] 98.2 F (36.8 C) (09/30 0735) Pulse Rate:  [64-104] 75 (09/30 0735) Resp:  [16-19] 17 (09/30 0735) BP: (89-117)/(59-81) 100/81 (09/30 0735) SpO2:  [92 %-97 %] 96 % (09/30 0735)     Height: 4' 10 (147.3 cm) Weight: 78.4 kg     Intake/Output last 2 shifts:  09/29 0701 - 09/30 0700 In: 595.9 [P.O.:120; IV Piggyback:475.9] Out: -    Physical Exam:  Constitutional: alert, cooperative and no distress  HENT: normocephalic without obvious abnormality  Eyes: PERRL, EOM's grossly intact and symmetric  Respiratory: breathing non-labored at rest  Cardiovascular: regular rate and sinus rhythm  Gastrointestinal: soft, she does not appear overtly tender, non-distended, no rebound/guarding.  Integumentary: Laparoscopic incisions are CDI with dermabond, no erythema or drainage    Labs:     Latest Ref Rng & Units 11/25/2023    5:42 AM 11/24/2023    3:54 AM 11/23/2023    8:25 AM  CBC  WBC 4.0 - 10.5 K/uL 14.9  17.1  15.7   Hemoglobin 12.0 - 15.0 g/dL 89.1  88.7  88.5   Hematocrit 36.0 - 46.0 % 34.2  34.2  35.4   Platelets 150 - 400 K/uL 211  218  203       Latest Ref Rng & Units 11/25/2023    5:42 AM 11/24/2023   12:21 PM 11/24/2023    3:54 AM  CMP  Glucose 70 - 99 mg/dL 90  887  889   BUN 6 - 20 mg/dL <5  5  <5   Creatinine 0.44 - 1.00 mg/dL  9.18  9.30  9.51   Sodium 135 - 145 mmol/L 139  139  138   Potassium 3.5 - 5.1 mmol/L 4.2  3.3  3.0   Chloride 98 - 111 mmol/L 102  100  102   CO2 22 - 32 mmol/L 27  28  27    Calcium 8.9 - 10.3 mg/dL 8.6  8.0  7.9   Total Protein 6.5 - 8.1 g/dL   6.5   Total Bilirubin 0.0 - 1.2 mg/dL   0.8   Alkaline Phos 38 - 126 U/L   82   AST 15 - 41 U/L   20   ALT 0 - 44 U/L   45     Imaging studies:   CT Chest/Abdomen/Pelvis (11/24/2023) personally reviewed with inflammatory changes around the pancreas and duodenum consistent with known pancreatitis, no evidence of abscess, necrosis, pseudocyst, and radiologist report reviewed below:  IMPRESSION: 1. Acute pancreatitis, grossly similar to prior MRI. 2. Reactive duodenal inflammation secondary to pancreatitis. 3. Small bilateral pleural effusions with associated lower lobe atelectasis. Pneumonia is difficult to exclude. 4. Postoperative subcutaneous and intramuscular gas in the left abdominal wall related to recent cholecystectomy.   Assessment/Plan:  18 y.o. female with gallstone pancreatitis without choledocholithiasis on MRCP s/p laparoscopic cholecystectomy on 09/26   -  Okay to continue soft diet; Encouraged low fat diet   - Continue IV Abx (Zosyn ); from a surgical perspective, no indication to continue this at home  - Monitor abdominal examination; on-going bowel function - Monitor leukocytosis; now improved - may be related to inflammatory changes noted on imaging secondary to pancreatitis; however, there is no evidence of abscess, necrosis, pseudocyst   - Pain control prn; antiemetics prn   - Mobilize - Further management per primary service   - Discharge Planning: Nothing further from surgical perspective, updated follow up and discharge instructions as well.   All of the above findings and recommendations were discussed with the patient, her family, and the medical team, and all of patient's questions were answered to her expressed  satisfaction.  -- Arthea Platt, PA-C [facility] 11/25/2023, 7:43 AM M-F: 7am - 4pm

## 2023-11-25 NOTE — Plan of Care (Signed)
  Problem: Education: Goal: Knowledge of General Education information will improve Description: Including pain rating scale, medication(s)/side effects and non-pharmacologic comfort measures Outcome: Progressing   Problem: Clinical Measurements: Goal: Diagnostic test results will improve Outcome: Progressing   Problem: Coping: Goal: Level of anxiety will decrease Outcome: Progressing   Problem: Pain Managment: Goal: General experience of comfort will improve and/or be controlled Outcome: Progressing   Problem: Safety: Goal: Ability to remain free from injury will improve Outcome: Progressing

## 2023-12-02 ENCOUNTER — Other Ambulatory Visit: Payer: Self-pay | Admitting: Primary Care

## 2023-12-02 DIAGNOSIS — G8929 Other chronic pain: Secondary | ICD-10-CM

## 2023-12-02 DIAGNOSIS — Z9049 Acquired absence of other specified parts of digestive tract: Secondary | ICD-10-CM

## 2023-12-03 ENCOUNTER — Observation Stay
Admission: EM | Admit: 2023-12-03 | Discharge: 2023-12-05 | Disposition: A | Discharge status: Home / Self Care | Payer: MEDICAID | Attending: Internal Medicine | Admitting: Internal Medicine

## 2023-12-03 ENCOUNTER — Emergency Department: Payer: MEDICAID

## 2023-12-03 ENCOUNTER — Other Ambulatory Visit: Payer: Self-pay

## 2023-12-03 DIAGNOSIS — K2980 Duodenitis without bleeding: Secondary | ICD-10-CM | POA: Insufficient documentation

## 2023-12-03 DIAGNOSIS — Z9049 Acquired absence of other specified parts of digestive tract: Secondary | ICD-10-CM | POA: Diagnosis not present

## 2023-12-03 DIAGNOSIS — F32A Depression, unspecified: Secondary | ICD-10-CM | POA: Diagnosis not present

## 2023-12-03 DIAGNOSIS — R10813 Right lower quadrant abdominal tenderness: Secondary | ICD-10-CM | POA: Diagnosis present

## 2023-12-03 DIAGNOSIS — R945 Abnormal results of liver function studies: Secondary | ICD-10-CM | POA: Insufficient documentation

## 2023-12-03 DIAGNOSIS — F419 Anxiety disorder, unspecified: Secondary | ICD-10-CM | POA: Insufficient documentation

## 2023-12-03 DIAGNOSIS — K863 Pseudocyst of pancreas: Secondary | ICD-10-CM | POA: Diagnosis not present

## 2023-12-03 DIAGNOSIS — K858 Other acute pancreatitis without necrosis or infection: Principal | ICD-10-CM

## 2023-12-03 DIAGNOSIS — K8020 Calculus of gallbladder without cholecystitis without obstruction: Secondary | ICD-10-CM | POA: Insufficient documentation

## 2023-12-03 DIAGNOSIS — K5900 Constipation, unspecified: Secondary | ICD-10-CM | POA: Insufficient documentation

## 2023-12-03 DIAGNOSIS — R109 Unspecified abdominal pain: Secondary | ICD-10-CM | POA: Diagnosis present

## 2023-12-03 DIAGNOSIS — K862 Cyst of pancreas: Secondary | ICD-10-CM | POA: Diagnosis not present

## 2023-12-03 DIAGNOSIS — K851 Biliary acute pancreatitis without necrosis or infection: Secondary | ICD-10-CM | POA: Insufficient documentation

## 2023-12-03 DIAGNOSIS — R519 Headache, unspecified: Secondary | ICD-10-CM | POA: Diagnosis not present

## 2023-12-03 DIAGNOSIS — R1085 Abdominal pain of multiple sites: Secondary | ICD-10-CM | POA: Diagnosis not present

## 2023-12-03 LAB — CBC
HCT: 40 % (ref 36.0–46.0)
Hemoglobin: 12.6 g/dL (ref 12.0–15.0)
MCH: 30.5 pg (ref 26.0–34.0)
MCHC: 31.5 g/dL (ref 30.0–36.0)
MCV: 96.9 fL (ref 80.0–100.0)
Platelets: 378 K/uL (ref 150–400)
RBC: 4.13 MIL/uL (ref 3.87–5.11)
RDW: 13.7 % (ref 11.5–15.5)
WBC: 7.3 K/uL (ref 4.0–10.5)
nRBC: 0 % (ref 0.0–0.2)

## 2023-12-03 LAB — POC URINE PREG, ED: Preg Test, Ur: NEGATIVE

## 2023-12-03 LAB — COMPREHENSIVE METABOLIC PANEL WITH GFR
ALT: 33 U/L (ref 0–44)
AST: 24 U/L (ref 15–41)
Albumin: 3.7 g/dL (ref 3.5–5.0)
Alkaline Phosphatase: 80 U/L (ref 38–126)
Anion gap: 10 (ref 5–15)
BUN: 6 mg/dL (ref 6–20)
CO2: 26 mmol/L (ref 22–32)
Calcium: 9.3 mg/dL (ref 8.9–10.3)
Chloride: 102 mmol/L (ref 98–111)
Creatinine, Ser: 0.66 mg/dL (ref 0.44–1.00)
GFR, Estimated: >60 mL/min (ref >60)
Glucose, Bld: 101 mg/dL — ABNORMAL HIGH (ref 70–99)
Potassium: 3.9 mmol/L (ref 3.5–5.1)
Sodium: 138 mmol/L (ref 135–145)
Total Bilirubin: 0.5 mg/dL (ref 0.0–1.2)
Total Protein: 8.4 g/dL — ABNORMAL HIGH (ref 6.5–8.1)

## 2023-12-03 LAB — URINALYSIS, ROUTINE W REFLEX MICROSCOPIC
Bilirubin Urine: NEGATIVE
Glucose, UA: NEGATIVE mg/dL
Hgb urine dipstick: NEGATIVE
Ketones, ur: NEGATIVE mg/dL
Leukocytes,Ua: NEGATIVE
Nitrite: NEGATIVE
Protein, ur: NEGATIVE mg/dL
Specific Gravity, Urine: 1.005 (ref 1.005–1.030)
pH: 8 (ref 5.0–8.0)

## 2023-12-03 LAB — HCG, QUANTITATIVE, PREGNANCY: hCG, Beta Chain, Quant, S: 1 m[IU]/mL (ref <5)

## 2023-12-03 LAB — LIPASE, BLOOD: Lipase: 53 U/L — ABNORMAL HIGH (ref 11–51)

## 2023-12-03 MED ORDER — ACETAMINOPHEN 650 MG RE SUPP: 650.0000 mg | Freq: Four times a day (QID) | RECTAL | Status: DC | PRN

## 2023-12-03 MED ORDER — IOHEXOL 300 MG/ML  SOLN
100.0000 mL | Freq: Once | INTRAMUSCULAR | Status: AC | PRN
Start: 2023-12-03 — End: 2023-12-03
  Administered 2023-12-03: 100 mL via INTRAVENOUS

## 2023-12-03 MED ORDER — MORPHINE SULFATE (PF) 2 MG/ML IV SOLN
2.0000 mg | Freq: Once | INTRAVENOUS | Status: AC
  Administered 2023-12-03: 2 mg via INTRAVENOUS
  Filled 2023-12-03 (×2): qty 1

## 2023-12-03 MED ORDER — SERTRALINE HCL 50 MG PO TABS
50.0000 mg | ORAL_TABLET | Freq: Every day | ORAL | Status: DC
Start: 2023-12-04 — End: 2023-12-05
  Administered 2023-12-04 – 2023-12-05 (×2): 50 mg via ORAL
  Filled 2023-12-03 (×2): qty 1

## 2023-12-03 MED ORDER — ENOXAPARIN SODIUM 40 MG/0.4ML IJ SOSY
40.0000 mg | PREFILLED_SYRINGE | INTRAMUSCULAR | Status: DC
  Administered 2023-12-03 – 2023-12-04 (×2): 40 mg via SUBCUTANEOUS
  Filled 2023-12-03 (×2): qty 0.4

## 2023-12-03 MED ORDER — POLYETHYLENE GLYCOL 3350 17 G PO PACK: 17.0000 g | PACK | Freq: Every day | ORAL | Status: DC | PRN

## 2023-12-03 MED ORDER — PANTOPRAZOLE SODIUM 40 MG IV SOLR
40.0000 mg | Freq: Two times a day (BID) | INTRAVENOUS | Status: DC
  Administered 2023-12-03 – 2023-12-05 (×4): 40 mg via INTRAVENOUS
  Filled 2023-12-03 (×4): qty 10

## 2023-12-03 MED ORDER — ALUM & MAG HYDROXIDE-SIMETH 200-200-20 MG/5ML PO SUSP
30.0000 mL | Freq: Four times a day (QID) | ORAL | Status: DC | PRN
Start: 2023-12-03 — End: 2023-12-05

## 2023-12-03 MED ORDER — MORPHINE SULFATE (PF) 2 MG/ML IV SOLN
2.0000 mg | Freq: Once | INTRAVENOUS | Status: AC
  Administered 2023-12-03: 2 mg via INTRAVENOUS
  Filled 2023-12-03: qty 1

## 2023-12-03 MED ORDER — ONDANSETRON HCL 4 MG PO TABS: 4.0000 mg | ORAL_TABLET | Freq: Four times a day (QID) | ORAL | Status: DC | PRN

## 2023-12-03 MED ORDER — KETOROLAC TROMETHAMINE 30 MG/ML IJ SOLN
15.0000 mg | Freq: Once | INTRAMUSCULAR | Status: AC
  Administered 2023-12-03: 15 mg via INTRAVENOUS
  Filled 2023-12-03: qty 1

## 2023-12-03 MED ORDER — ACETAMINOPHEN 325 MG PO TABS
650.0000 mg | ORAL_TABLET | Freq: Four times a day (QID) | ORAL | Status: DC | PRN
  Administered 2023-12-03 – 2023-12-04 (×2): 650 mg via ORAL
  Filled 2023-12-03 (×3): qty 2

## 2023-12-03 MED ORDER — OXYCODONE HCL 5 MG PO TABS
5.0000 mg | ORAL_TABLET | ORAL | Status: DC | PRN
  Administered 2023-12-04 (×2): 5 mg via ORAL
  Filled 2023-12-03 (×3): qty 1

## 2023-12-03 MED ORDER — SENNA 8.6 MG PO TABS
1.0000 | ORAL_TABLET | Freq: Two times a day (BID) | ORAL | Status: DC
  Administered 2023-12-03 – 2023-12-05 (×4): 8.6 mg via ORAL
  Filled 2023-12-03 (×4): qty 1

## 2023-12-03 MED ORDER — SODIUM CHLORIDE 0.9 % IV BOLUS
500.0000 mL | Freq: Once | INTRAVENOUS | Status: AC
  Administered 2023-12-03: 500 mL via INTRAVENOUS

## 2023-12-03 MED ORDER — SODIUM CHLORIDE 0.9 % IV SOLN: INTRAVENOUS | Status: DC

## 2023-12-03 MED ORDER — MORPHINE SULFATE (PF) 2 MG/ML IV SOLN
2.0000 mg | INTRAVENOUS | Status: DC | PRN
  Administered 2023-12-03: 2 mg via INTRAVENOUS
  Filled 2023-12-03: qty 1

## 2023-12-03 MED ORDER — DOCUSATE SODIUM 100 MG PO CAPS
100.0000 mg | ORAL_CAPSULE | Freq: Two times a day (BID) | ORAL | Status: DC
  Administered 2023-12-03 – 2023-12-05 (×4): 100 mg via ORAL
  Filled 2023-12-03 (×4): qty 1

## 2023-12-03 MED ORDER — ONDANSETRON HCL 4 MG/2ML IJ SOLN
4.0000 mg | Freq: Four times a day (QID) | INTRAMUSCULAR | Status: DC | PRN
  Administered 2023-12-03 – 2023-12-04 (×2): 4 mg via INTRAVENOUS
  Filled 2023-12-03 (×2): qty 2

## 2023-12-03 MED ORDER — ONDANSETRON HCL 4 MG/2ML IJ SOLN
4.0000 mg | INTRAMUSCULAR | Status: AC
  Administered 2023-12-03: 4 mg via INTRAVENOUS
  Filled 2023-12-03: qty 2

## 2023-12-03 MED ORDER — ACETAMINOPHEN 500 MG PO TABS
1000.0000 mg | ORAL_TABLET | ORAL | Status: AC
  Administered 2023-12-03: 1000 mg via ORAL
  Filled 2023-12-03: qty 2

## 2023-12-03 NOTE — ED Notes (Signed)
 Multiple IV attempts by RN and medic. IV team consult placed. MD made aware.

## 2023-12-03 NOTE — ED Notes (Signed)
 Pt in imaging at this time.

## 2023-12-03 NOTE — ED Triage Notes (Signed)
 Patient states pain to right abdomen and headaches since cholecystectomy on 11/25/2023.

## 2023-12-03 NOTE — H&P (Addendum)
 History and Physical    Teresa Rasmussen FMW:[id number] DOB: 2005/06/22 DOA: 12/03/2023  DOS: the patient was seen and examined on 12/03/2023  PCP: [facility]   Patient coming from: Home  I have personally briefly reviewed patient's old medical records in [facility]  Chief Complaint: Abdominal pain/nausea  HPI: Teresa Rasmussen is a pleasant 18 y.o. female with medical history significant for recent gallstone pancreatitis s/p robotic laparoscopic cholecystectomy 11/21/2023, complicated with abdominal pain post surgery, acute pancreatitis, right pleural effusion, reactive duodenitis, mild intellectual disability, depression who was discharged from hospital on 11/25/2023 came in today complaining of right lower quadrant and epigastric abdominal pain 6/10 in intensity, intermittent, sharp, associate with some nausea no vomiting.  She did not have any bowel movement for the last 3 or 4 days.  She denies any fever or chills.  Due to persistent abdominal pain patient went to the outpatient clinics couple times.  Did not get better due to persistent abdominal pain and nausea, patient's mother was concerned and brought her to the emergency room for evaluation.  ED Course: Upon arrival to the ED, patient is found to have abdominal pain, afebrile, CT abdomen and pelvis showed organizing fluid collections around the pancreas suggestive of developing pseudocyst.  Hospitalist service was consulted for evaluation for admission for abdominal pain acute pancreatitis, possible pseudocyst.  Review of Systems:  ROS  All other systems negative except as noted in the HPI.  History reviewed. No pertinent past medical history.  Past Surgical History:  Procedure Laterality Date   CHOLECYSTECTOMY       reports that she has never smoked. She has never used smokeless tobacco. She reports that she does not drink alcohol and does not use drugs.  No Known Allergies  History  reviewed. No pertinent family history.  Prior to Admission medications   Medication Sig Start Date End Date Taking? Authorizing Provider  Acetaminophen  Extra Strength 500 MG TABS Take 1-2 tablets by mouth 3 (three) times daily as needed. 12/02/23  Yes [provider]  ascorbic acid  (VITAMIN C ) 500 MG tablet Take 1 tablet (500 mg total) by mouth daily. 11/26/23 02/24/24 Yes Von Bellis, MD  cephALEXin (KEFLEX) 500 MG capsule Take 500 mg by mouth 3 (three) times daily. 12/02/23  Yes [provider]  cyanocobalamin  1000 MCG tablet Take 1 tablet (1,000 mcg total) by mouth daily. 11/26/23 02/24/24 Yes Von Bellis, MD  ibuprofen  (ADVIL ) 600 MG tablet Take 600 mg by mouth 3 (three) times daily. 11/27/23  Yes [provider]  iron  polysaccharides (NIFEREX) 150 MG capsule Take 1 capsule (150 mg total) by mouth daily. 11/26/23 02/24/24 Yes Von Bellis, MD  [facility] 17 GM/SCOOP powder Take 17 g by mouth daily. 11/05/23  Yes [provider]  omeprazole (PRILOSEC) 20 MG capsule Take 20 mg by mouth 2 (two) times daily. 08/07/23  Yes [provider]  sertraline  (ZOLOFT ) 50 MG tablet Take 50 mg by mouth daily. 11/05/23  Yes [provider]  Vitamin D , Ergocalciferol , (DRISDOL ) 1.25 MG (50000 UNIT) CAPS capsule Take 1 capsule (50,000 Units total) by mouth every 7 (seven) days. 11/28/23 02/26/24 Yes Von Bellis, MD    Physical Exam: Vitals:   12/03/23 1400 12/03/23 1430 12/03/23 1449 12/03/23 1500  BP: (!) 96/59 97/61  108/84  Pulse: 63 69  90  Resp: 16   14  Temp:   98.4 F (36.9 C)   TempSrc:   Oral   SpO2: 98% 96%  99%  Height:        Physical Exam   Constitutional: Alert, awake, calm, comfortable HEENT: Neck supple Respiratory: Clear to auscultation B/L, no wheezing, no rales.  Cardiovascular: Regular rate and rhythm, no murmurs / rubs / gallops. No extremity edema. 2+ pedal pulses. No carotid bruits.  Abdomen: Mild epigastric tenderness, no  rebound, bowel sounds present.  Musculoskeletal: no clubbing / cyanosis. Good ROM, no contractures. Normal muscle tone.  Skin: no rashes, lesions, ulcers. Neurologic: CN 2-12 grossly intact. Sensation intact, No focal deficit identified Psychiatric: Alert and oriented x 3. Normal mood.    Labs on Admission: I have personally reviewed following labs and imaging studies  CBC: Recent Labs  Lab 12/03/23 1000  WBC 7.3  HGB 12.6  HCT 40.0  MCV 96.9  PLT 378   Basic Metabolic Panel: Recent Labs  Lab 12/03/23 1000  NA 138  K 3.9  CL 102  CO2 26  GLUCOSE 101*  BUN 6  CREATININE 0.66  CALCIUM 9.3   GFR: CrCl cannot be calculated (Unknown ideal weight.). Liver Function Tests: Recent Labs  Lab 12/03/23 1000  AST 24  ALT 33  ALKPHOS 80  BILITOT 0.5  PROT 8.4*  ALBUMIN 3.7   Recent Labs  Lab 12/03/23 1000  LIPASE 53*   No results for input(s): AMMONIA in the last 168 hours. Coagulation Profile: No results for input(s): INR, PROTIME in the last 168 hours. Cardiac Enzymes: No results for input(s): CKTOTAL, CKMB, CKMBINDEX, TROPONINI, TROPONINIHS in the last 168 hours. BNP (last 3 results) No results for input(s): BNP in the last 8760 hours. HbA1C: No results for input(s): HGBA1C in the last 72 hours. CBG: No results for input(s): GLUCAP in the last 168 hours. Lipid Profile: No results for input(s): CHOL, HDL, LDLCALC, TRIG, CHOLHDL, LDLDIRECT in the last 72 hours. Thyroid Function Tests: No results for input(s): TSH, T4TOTAL, FREET4, T3FREE, THYROIDAB in the last 72 hours. Anemia Panel: No results for input(s): VITAMINB12, FOLATE, FERRITIN, TIBC, IRON , RETICCTPCT in the last 72 hours. Urine analysis:    Component Value Date/Time   COLORURINE STRAW (A) 12/03/2023 1054   APPEARANCEUR CLEAR (A) 12/03/2023 1054   APPEARANCEUR Clear 01/14/2014 1724   LABSPEC 1.005 12/03/2023 1054   LABSPEC 1.011 01/14/2014  1724   PHURINE 8.0 12/03/2023 1054   GLUCOSEU NEGATIVE 12/03/2023 1054   GLUCOSEU Negative 01/14/2014 1724   HGBUR NEGATIVE 12/03/2023 1054   BILIRUBINUR NEGATIVE 12/03/2023 1054   BILIRUBINUR Negative 01/14/2014 1724   KETONESUR NEGATIVE 12/03/2023 1054   PROTEINUR NEGATIVE 12/03/2023 1054   NITRITE NEGATIVE 12/03/2023 1054   LEUKOCYTESUR NEGATIVE 12/03/2023 1054   LEUKOCYTESUR Negative 01/14/2014 1724    Radiological Exams on Admission: I have personally reviewed images CT ABDOMEN PELVIS W CONTRAST Result Date: 12/03/2023 CLINICAL DATA:  Right lower quadrant pain. Sharp right abdominal pain. Recent pancreatitis and cholecystectomy. EXAM: CT ABDOMEN AND PELVIS WITH CONTRAST TECHNIQUE: Multidetector CT imaging of the abdomen and pelvis was performed using the standard protocol following bolus administration of intravenous contrast. RADIATION DOSE REDUCTION: This exam was performed according to the departmental dose-optimization program which includes automated exposure control, adjustment of the mA and/or kV according to patient size and/or use of iterative reconstruction technique. CONTRAST:  OMNIPAQUE  IOHEXOL  300 MG/ML  SOLN COMPARISON:  CT abdomen pelvis 11/24/2023 FINDINGS: Lower chest: Patchy densities at the lung bases are suggestive for atelectasis. No pleural effusions. Hepatobiliary: Gallbladder has been removed. No significant fluid in the gallbladder fossa. No biliary dilatation. Main portal  venous system is patent. No discrete liver lesion. Pancreas: Fluid cephalad to the pancreatic body is more organized and suggestive for a developing pseudocyst. Collection measures 5.0 x 3.0 cm on image 27/2. Additional fluid anterior to the caudal aspect of the pancreas on image 34. The stranding and inflammatory changes in the central abdomen and around the pancreas have decreased compared to the previous examination. There is no significant pancreatic duct dilatation. Enhancement of the  pancreas without necrosis. Spleen: Normal in size without focal abnormality. Adrenals/Urinary Tract: Normal adrenal glands. Normal appearance of both kidneys without hydronephrosis. No suspicious renal lesion. Normal urinary bladder. Stomach/Bowel: Normal appearance of the stomach and duodenum. No bowel dilatation. No evidence for a bowel obstruction. No acute bowel inflammation. There appears to be a small normal appendix. Vascular/Lymphatic: Aorta and main visceral arteries are patent. Portal venous system and splenic vein are patent. Normal appearance of the IVC and renal veins. No significant lymph node enlargement in the abdomen or pelvis. Reproductive: Uterus and bilateral adnexa are unremarkable. Other: Negative for ascites.  Negative for free air. Musculoskeletal: Subcutaneous edema in the abdomen has decreased. No acute bone abnormality. Subcutaneous stranding in the anterior abdomen compatible with changes from laparoscopic cholecystectomy. IMPRESSION: 1. Inflammatory changes in the central abdomen and around the pancreas have slightly decreased but there are organizing fluid collections around the pancreas suggestive for developing pseudocysts. 2. Status post cholecystectomy. No significant fluid in the gallbladder fossa. No biliary dilatation. 3. Patchy densities at the lung bases are suggestive for atelectasis. Electronically Signed   By: Juliene Balder M.D.   On: 12/03/2023 14:51   US  PELVIC COMPLETE W TRANSVAGINAL AND TORSION R/O Result Date: 12/03/2023 CLINICAL DATA:  [id number] RLQ abdominal pain [id number] EXAM: TRANSABDOMINAL AND TRANSVAGINAL ULTRASOUND OF PELVIS DOPPLER ULTRASOUND OF OVARIES TECHNIQUE: Both transabdominal and transvaginal ultrasound examinations of the pelvis were performed. Transabdominal technique was performed for global imaging of the pelvis including uterus, ovaries, adnexal regions, and pelvic cul-de-sac. It was necessary to proceed with endovaginal exam following the transabdominal  exam to visualize the uterus and ovaries. Color and duplex Doppler ultrasound was utilized to evaluate blood flow to the ovaries. COMPARISON:  11/24/2023 FINDINGS: Uterus Measurements: 7.4 x 3.6 x 4.4 cm = volume: 54 mL. No fibroids or other mass visualized. Endometrium Thickness: 9 mm. No focal abnormality visualized. Small amount of fluid within the endocervical canal. Right ovary Measurements: 2.5 x 2.1 x 2.2 cm = volume: 5.9 mL. Dominant follicle measuring 1.5 cm. Left ovary Obscured by overlying bowel gas. Pulsed Doppler evaluation demonstrates normal low-resistance arterial and venous waveforms in the right ovary. Other findings:  No abnormal free fluid IMPRESSION: 1. The left ovary was not visualized due to overlying bowel gas. Otherwise, nonenlarged right ovary. No sonographic findings to suggest right ovarian torsion, at this time. 2. Normal appearance of the uterus and endometrium. Electronically Signed   By: Rogelia Myers M.D.   On: 12/03/2023 13:56    EKG: N/A    Assessment/Plan Principal Problem:   Pancreatic pseudocyst/cyst Active Problems:   Acute biliary pancreatitis without infection or necrosis   Abdominal pain    Assessment and Plan: This is a 18 year old female with mild intellectual disability, depression, acute gallstone pancreatitis s/p robotic assisted laparoscopic cholecystectomy all complicated by pancreatitis, duodenitis now coming in for abdominal pain and nausea.  1.  Abdominal pain/nausea/pancreatic pseudocyst - She will be placed in observation - Her lipase is 53, no leukocytosis - She will be given IV fluid, clear  liquid diet, pain medications, antinausea medications - Surgical service will be consulted for evaluation.  2.  GERD/duodenitis - She will be placed on Protonix  IV along with Maalox  3.  Depression - Resume her home medications  4.  Constipation - She will be placed on bowel regimen     DVT prophylaxis: Lovenox  Code Status: Full  Code Family Communication: Mother at bedside Disposition Plan: Home Consults called: General Surgery Admission status: Observation, Med-Surg   Nena Rebel, MD [facility] 12/03/2023, 3:57 PM

## 2023-12-03 NOTE — ED Provider Notes (Signed)
 [facility] Provider Note   Event Date/Time   First MD Initiated Contact with Patient 12/03/23 1012     (approximate)  History   Abdominal Pain  HPI  Teresa Rasmussen is a 18 y.o. female history of recent cholecystectomy complicated with associated pancreatitis.  Patient reports that last night she started having a sharp pain in her right mid lower abdomen.  No nausea or vomiting no fever.  She also reports a mild headache but primarily a sharp pain in her mid to right lower abdomen.  She reports the pain is severe.  Fairly consistent since last night  She has not had any fevers     Physical Exam   Triage Vital Signs: ED Triage Vitals  Encounter Vitals Group     BP 12/03/23 0955 (!) 134/92     Girls Systolic BP Percentile --      Girls Diastolic BP Percentile --      Boys Systolic BP Percentile --      Boys Diastolic BP Percentile --      Pulse Rate 12/03/23 0955 81     Resp 12/03/23 0955 18     Temp 12/03/23 0955 98 F (36.7 C)     Temp Source 12/03/23 0955 Oral     SpO2 12/03/23 0955 98 %     Weight --      Height 12/03/23 0956 4' 11 (1.499 m)     Head Circumference --      Peak Flow --      Pain Score 12/03/23 0956 10     Pain Loc --      Pain Education --      Exclude from Growth Chart --     Most recent vital signs: Vitals:   12/03/23 1430 12/03/23 1449  BP: 97/61   Pulse: 69   Resp:    Temp:  98.4 F (36.9 C)  SpO2: 96%      General: Awake, no distress.  CV:  Good peripheral perfusion.  Normal tones and rate Resp:  Normal effort.  Clear bilateral Abd:  No distention.  3 trocar sites clean dry and intact.  No surrounding erythema or induration.  She reports moderate discomfort primarily in the right flank to right lower quadrant without rebound or guarding.  No discrete focal pain in the right upper quadrant noted at this time.  There is no bruising or swelling of the abdomen no obvious ascites. Other:    Patient  denies pregnancy.  Patient also reports has not had a vaginal bleeding, she is currently has Nexplanon and has not had a period for some time.  She is not sexually active now and has not been sexually active in the recent past.  No vaginal discharge   ED Results / Procedures / Treatments   Labs (all labs ordered are listed, but only abnormal results are displayed) Labs Reviewed  LIPASE, BLOOD - Abnormal; Notable for the following components:      Result Value   Lipase 53 (*)    All other components within normal limits  COMPREHENSIVE METABOLIC PANEL WITH GFR - Abnormal; Notable for the following components:   Glucose, Bld 101 (*)    Total Protein 8.4 (*)    All other components within normal limits  URINALYSIS, ROUTINE W REFLEX MICROSCOPIC - Abnormal; Notable for the following components:   Color, Urine STRAW (*)    APPearance CLEAR (*)    All other components within normal limits  CBC  HCG, QUANTITATIVE, PREGNANCY  POC URINE PREG, ED     EKG     RADIOLOGY  CT ABDOMEN PELVIS W CONTRAST Result Date: 12/03/2023 CLINICAL DATA:  Right lower quadrant pain. Sharp right abdominal pain. Recent pancreatitis and cholecystectomy. EXAM: CT ABDOMEN AND PELVIS WITH CONTRAST TECHNIQUE: Multidetector CT imaging of the abdomen and pelvis was performed using the standard protocol following bolus administration of intravenous contrast. RADIATION DOSE REDUCTION: This exam was performed according to the departmental dose-optimization program which includes automated exposure control, adjustment of the mA and/or kV according to patient size and/or use of iterative reconstruction technique. CONTRAST:  OMNIPAQUE  IOHEXOL  300 MG/ML  SOLN COMPARISON:  CT abdomen pelvis 11/24/2023 FINDINGS: Lower chest: Patchy densities at the lung bases are suggestive for atelectasis. No pleural effusions. Hepatobiliary: Gallbladder has been removed. No significant fluid in the gallbladder fossa. No biliary dilatation.  Main portal venous system is patent. No discrete liver lesion. Pancreas: Fluid cephalad to the pancreatic body is more organized and suggestive for a developing pseudocyst. Collection measures 5.0 x 3.0 cm on image 27/2. Additional fluid anterior to the caudal aspect of the pancreas on image 34. The stranding and inflammatory changes in the central abdomen and around the pancreas have decreased compared to the previous examination. There is no significant pancreatic duct dilatation. Enhancement of the pancreas without necrosis. Spleen: Normal in size without focal abnormality. Adrenals/Urinary Tract: Normal adrenal glands. Normal appearance of both kidneys without hydronephrosis. No suspicious renal lesion. Normal urinary bladder. Stomach/Bowel: Normal appearance of the stomach and duodenum. No bowel dilatation. No evidence for a bowel obstruction. No acute bowel inflammation. There appears to be a small normal appendix. Vascular/Lymphatic: Aorta and main visceral arteries are patent. Portal venous system and splenic vein are patent. Normal appearance of the IVC and renal veins. No significant lymph node enlargement in the abdomen or pelvis. Reproductive: Uterus and bilateral adnexa are unremarkable. Other: Negative for ascites.  Negative for free air. Musculoskeletal: Subcutaneous edema in the abdomen has decreased. No acute bone abnormality. Subcutaneous stranding in the anterior abdomen compatible with changes from laparoscopic cholecystectomy. IMPRESSION: 1. Inflammatory changes in the central abdomen and around the pancreas have slightly decreased but there are organizing fluid collections around the pancreas suggestive for developing pseudocysts. 2. Status post cholecystectomy. No significant fluid in the gallbladder fossa. No biliary dilatation. 3. Patchy densities at the lung bases are suggestive for atelectasis. Electronically Signed   By: Juliene Balder M.D.   On: 12/03/2023 14:51   US  PELVIC COMPLETE W  TRANSVAGINAL AND TORSION R/O Result Date: 12/03/2023 CLINICAL DATA:  [id number] RLQ abdominal pain [id number] EXAM: TRANSABDOMINAL AND TRANSVAGINAL ULTRASOUND OF PELVIS DOPPLER ULTRASOUND OF OVARIES TECHNIQUE: Both transabdominal and transvaginal ultrasound examinations of the pelvis were performed. Transabdominal technique was performed for global imaging of the pelvis including uterus, ovaries, adnexal regions, and pelvic cul-de-sac. It was necessary to proceed with endovaginal exam following the transabdominal exam to visualize the uterus and ovaries. Color and duplex Doppler ultrasound was utilized to evaluate blood flow to the ovaries. COMPARISON:  11/24/2023 FINDINGS: Uterus Measurements: 7.4 x 3.6 x 4.4 cm = volume: 54 mL. No fibroids or other mass visualized. Endometrium Thickness: 9 mm. No focal abnormality visualized. Small amount of fluid within the endocervical canal. Right ovary Measurements: 2.5 x 2.1 x 2.2 cm = volume: 5.9 mL. Dominant follicle measuring 1.5 cm. Left ovary Obscured by overlying bowel gas. Pulsed Doppler evaluation demonstrates normal low-resistance arterial and venous waveforms in the  right ovary. Other findings:  No abnormal free fluid IMPRESSION: 1. The left ovary was not visualized due to overlying bowel gas. Otherwise, nonenlarged right ovary. No sonographic findings to suggest right ovarian torsion, at this time. 2. Normal appearance of the uterus and endometrium. Electronically Signed   By: Rogelia Myers M.D.   On: 12/03/2023 13:56     Personally interpreted the patient's CT abdomen pelvis as concerning for inflammatory changes around the pancreas   PROCEDURES:  Critical Care performed: No  Procedures   MEDICATIONS ORDERED IN ED: Medications  morphine  (PF) 2 MG/ML injection 2 mg (2 mg Intravenous Given 12/03/23 1142)  ondansetron  (ZOFRAN ) injection 4 mg (4 mg Intravenous Given 12/03/23 1142)  ketorolac  (TORADOL ) 30 MG/ML injection 15 mg (15 mg Intravenous Given  12/03/23 1141)  sodium chloride  0.9 % bolus 500 mL (0 mLs Intravenous Stopped 12/03/23 1424)  iohexol  (OMNIPAQUE ) 300 MG/ML solution 100 mL (100 mLs Intravenous Contrast Given 12/03/23 1410)  morphine  (PF) 2 MG/ML injection 2 mg (2 mg Intravenous Given 12/03/23 1321)  acetaminophen  (TYLENOL ) tablet 1,000 mg (1,000 mg Oral Given 12/03/23 1449)  morphine  (PF) 2 MG/ML injection 2 mg (2 mg Intravenous Given 12/03/23 1449)     IMPRESSION / MDM / ASSESSMENT AND PLAN / ED COURSE  I reviewed the triage vital signs and the nursing notes.                              Differential diagnosis includes but is not limited to, abdominal perforation, aortic dissection, cholecystitis, appendicitis, diverticulitis, colitis, esophagitis/gastritis, kidney stone, pyelonephritis, urinary tract infection, aortic aneurysm. All are considered in decision and treatment plan. Based upon the patient's presentation and risk factors, as well as her recent surgical procedure I will proceed with obtaining imaging including CT abdomen pelvis.  Currently awaiting labs including CBC metabolic panel lipase.  She does not appear acutely toxic she is not febrile.  She does however have pain focally over the right mid abdomen.  Also though it seems unlikely by pretest and by clinical exam and history will check transvaginal ultrasound to assure no acute right ovarian process or torsion though I think this is unlikely given the context of recent surgical history.  Additionally she denies any GYN symptoms no recent sexual activity no discharge.  Symptoms not consistent with you to acute gynecologic/ob illness or infection.  Negative preg test  LFTs normal minimally elevated lipase.  Normal biliary ductal caliber on CT  Patient's presentation is most consistent with acute illness / injury with system symptoms.      Clinical Course as of 12/03/23 1509  Wed Dec 03, 2023  1104 CBC normal [MQ]  1403 Right ovary was visualized, no  evidence of torsion.  Pain is located in the right side.  Doubt left ovary would be cause of pain given the anatomy and location of pain albeit on the right.  Awaiting CT imaging at this time [MQ]    Clinical Course User Index [MQ] Dicky Anes, MD   ----------------------------------------- 3:08 PM on 12/03/2023 ----------------------------------------- CT imaging demonstrating findings of ongoing inflammatory change and pancreatitis.  Additionally some fluid formation is now concerning for possible early pseudocysts.  Given the patient's degree of pain recent admission, and active pancreatitis on imaging I discussed with her would be most comfortable with plan for admission and observation bowel rest.  Discussed with hospitalist Dr. Roann who is accepting of admission.  Do not see any  evidence of acute necrotizing pancreatitis, infection fever or elevated white count or evidence that would support acute choledocholithiasis at this time    FINAL CLINICAL IMPRESSION(S) / ED DIAGNOSES   Final diagnoses:  Other acute pancreatitis, unspecified complication status     Rx / DC Orders   ED Discharge Orders     None        Note:  This document was prepared using Dragon voice recognition software and may include unintentional dictation errors.   Dicky Anes, MD 12/03/23 [phone]

## 2023-12-03 NOTE — Consult Note (Addendum)
 [facility] SURGICAL CONSULTATION NOTE (initial)   HISTORY OF PRESENT ILLNESS (HPI):  18 y.o. female presented to [facility] ED today for evaluation of right sided abdominal pain, nausea and headaches.  This patient is well-known to us  for her most recent admission for gallstone pancreatitis.  She underwent robotic cholecystectomy, and her postoperative course was extended due to various issues including the slow resolution of her pancreatitis sequelae, with nausea and sluggish progress with her diet.  She subsequently discharged 4 days postop.    Patient reports right lower quadrant which began yesterday evening and was exacerbated by position, it is persisted she also has persisting/lingering epigastric abdominal pain 6/10 in intensity, associated with nausea without vomiting.  She had some loose stools on a soft diet, but she admits to no bowel movement for the last 3 or 4 days.  She denies any fever or chills.  Because of her persistent abdominal pain and nausea, patient's mother brought her to the emergency room for evaluation, unsatisfied with recent outpatient consultations.  Surgery is consulted by hospitalist physician Dr. Paudel in this context for evaluation and management sequelae of recent biliary pancreatitis.  PAST MEDICAL HISTORY (PMH):  History reviewed. No pertinent past medical history.   PAST SURGICAL HISTORY (PSH):  Past Surgical History:  Procedure Laterality Date   CHOLECYSTECTOMY       MEDICATIONS:  Prior to Admission medications   Medication Sig Start Date End Date Taking? Authorizing Provider  Acetaminophen  Extra Strength 500 MG TABS Take 1-2 tablets by mouth 3 (three) times daily as needed. 12/02/23  Yes [provider]  ascorbic acid  (VITAMIN C ) 500 MG tablet Take 1 tablet (500 mg total) by mouth daily. 11/26/23 02/24/24 Yes Von Bellis, MD  cephALEXin (KEFLEX) 500 MG capsule Take 500 mg by mouth 3 (three) times daily. 12/02/23  Yes [provider]  cyanocobalamin  1000 MCG tablet Take 1 tablet (1,000 mcg total) by mouth daily. 11/26/23 02/24/24 Yes Von Bellis, MD  ibuprofen  (ADVIL ) 600 MG tablet Take 600 mg by mouth 3 (three) times daily. 11/27/23  Yes [provider]  iron  polysaccharides (NIFEREX) 150 MG capsule Take 1 capsule (150 mg total) by mouth daily. 11/26/23 02/24/24 Yes Von Bellis, MD  [facility] 17 GM/SCOOP powder Take 17 g by mouth daily. 11/05/23  Yes [provider]  omeprazole (PRILOSEC) 20 MG capsule Take 20 mg by mouth 2 (two) times daily. 08/07/23  Yes [provider]  sertraline  (ZOLOFT ) 50 MG tablet Take 50 mg by mouth daily. 11/05/23  Yes [provider]  Vitamin D , Ergocalciferol , (DRISDOL ) 1.25 MG (50000 UNIT) CAPS capsule Take 1 capsule (50,000 Units total) by mouth every 7 (seven) days. 11/28/23 02/26/24 Yes Von Bellis, MD     ALLERGIES:  No Known Allergies   SOCIAL HISTORY:  Social History   Socioeconomic History   Marital status: Single    Spouse name: Not on file   Number of children: Not on file   Years of education: Not on file   Highest education level: Not on file  Occupational History   Not on file  Tobacco Use   Smoking status: Never   Smokeless tobacco: Never  Substance and Sexual Activity   Alcohol use: No   Drug use: Never   Sexual activity: Not on file    Comment: Nextplanon  Other Topics Concern   Not on file  Social History Narrative   Not on file   Social Drivers of Corporate investment banker  Strain: Not on file  Food Insecurity: No Food Insecurity (11/18/2023)   Hunger Vital Sign    Worried About Running Out of Food in the Last Year: Never true    Ran Out of Food in the Last Year: Never true  Transportation Needs: No Transportation Needs (11/18/2023)   PRAPARE - Administrator, Civil Service (Medical): No    Lack of Transportation (Non-Medical): No  Physical Activity: Not on file  Stress: Not on file  Social  Connections: Not on file  Intimate Partner Violence: Not At Risk (11/18/2023)   Humiliation, Afraid, Rape, and Kick questionnaire    Fear of Current or Ex-Partner: No    Emotionally Abused: No    Physically Abused: No    Sexually Abused: No     FAMILY HISTORY:  History reviewed. No pertinent family history.    VITAL SIGNS:  Temp:  [98 F (36.7 C)-98.4 F (36.9 C)] 98.4 F (36.9 C) (10/08 1449) Pulse Rate:  [63-90] 90 (10/08 1500) Resp:  [14-22] 14 (10/08 1500) BP: (96-134)/(50-95) 108/84 (10/08 1500) SpO2:  [96 %-100 %] 99 % (10/08 1500)     Height: 4' 11 (149.9 cm)       INTAKE/OUTPUT:  No intake/output data recorded.  PHYSICAL EXAM:  Physical Exam Blood pressure 108/84, pulse 90, temperature 98.4 F (36.9 C), temperature source Oral, resp. rate 14, height 4' 11 (1.499 m), SpO2 99%.   CONSTITUTIONAL: Well developed, and nourished, appropriately responsive and aware without distress.   EYES: Sclera non-icteric.   EARS, NOSE, MOUTH AND THROAT:  The oropharynx is clear. Oral mucosa is pink and moist.     Hearing is intact to voice.  NECK: Trachea is midline, and there is no jugular venous distension.  LYMPH NODES:  Lymph nodes in the neck are not enlarged. RESPIRATORY:   Normal respiratory effort without pathologic use of accessory muscles. CARDIOVASCULAR:  Well perfused.  GI: The abdomen is notable for recent robotic scars, unremarkable without erythema, ecchymosis or induration.  Her abdomen remains soft, nontender, and nondistended.  There is some subjective tenderness in the epigastrium, there were no palpable masses. I did not appreciate hepatosplenomegaly.  Her abdomen is soft to palpation throughout without guarding, rigidity or rebound. MUSCULOSKELETAL:  Symmetrical muscle tone appreciated in all four extremities. Warm without edema.  SKIN: Skin turgor is normal. No pathologic skin lesions appreciated.  NEUROLOGIC:  Motor and sensation appear grossly normal.  Cranial  nerves are grossly without defect. PSYCH:  Alert and oriented to person, place and time. Affect is appropriate for situation.  Data Reviewed I have personally reviewed what is currently available of the patient's imaging, recent labs and medical records.    Labs:     Latest Ref Rng & Units 12/03/2023   10:00 AM 11/25/2023    5:42 AM 11/24/2023    3:54 AM  CBC  WBC 4.0 - 10.5 K/uL 7.3  14.9  17.1   Hemoglobin 12.0 - 15.0 g/dL 87.3  89.1  88.7   Hematocrit 36.0 - 46.0 % 40.0  34.2  34.2   Platelets 150 - 400 K/uL 378  211  218       Latest Ref Rng & Units 12/03/2023   10:00 AM 11/25/2023    5:42 AM 11/24/2023   12:21 PM  CMP  Glucose 70 - 99 mg/dL 898  90  887   BUN 6 - 20 mg/dL 6  <5  5   Creatinine 9.55 - 1.00 mg/dL 9.33  0.81  0.69   Sodium 135 - 145 mmol/L 138  139  139   Potassium 3.5 - 5.1 mmol/L 3.9  4.2  3.3   Chloride 98 - 111 mmol/L 102  102  100   CO2 22 - 32 mmol/L 26  27  28    Calcium 8.9 - 10.3 mg/dL 9.3  8.6  8.0   Total Protein 6.5 - 8.1 g/dL 8.4     Total Bilirubin 0.0 - 1.2 mg/dL 0.5     Alkaline Phos 38 - 126 U/L 80     AST 15 - 41 U/L 24     ALT 0 - 44 U/L 33        Imaging studies:  CT images personally reviewed.  Last 24 hrs: CT ABDOMEN PELVIS W CONTRAST Result Date: 12/03/2023 CLINICAL DATA:  Right lower quadrant pain. Sharp right abdominal pain. Recent pancreatitis and cholecystectomy. EXAM: CT ABDOMEN AND PELVIS WITH CONTRAST TECHNIQUE: Multidetector CT imaging of the abdomen and pelvis was performed using the standard protocol following bolus administration of intravenous contrast. RADIATION DOSE REDUCTION: This exam was performed according to the departmental dose-optimization program which includes automated exposure control, adjustment of the mA and/or kV according to patient size and/or use of iterative reconstruction technique. CONTRAST:  OMNIPAQUE  IOHEXOL  300 MG/ML  SOLN COMPARISON:  CT abdomen pelvis 11/24/2023 FINDINGS: Lower chest: Patchy  densities at the lung bases are suggestive for atelectasis. No pleural effusions. Hepatobiliary: Gallbladder has been removed. No significant fluid in the gallbladder fossa. No biliary dilatation. Main portal venous system is patent. No discrete liver lesion. Pancreas: Fluid cephalad to the pancreatic body is more organized and suggestive for a developing pseudocyst. Collection measures 5.0 x 3.0 cm on image 27/2. Additional fluid anterior to the caudal aspect of the pancreas on image 34. The stranding and inflammatory changes in the central abdomen and around the pancreas have decreased compared to the previous examination. There is no significant pancreatic duct dilatation. Enhancement of the pancreas without necrosis. Spleen: Normal in size without focal abnormality. Adrenals/Urinary Tract: Normal adrenal glands. Normal appearance of both kidneys without hydronephrosis. No suspicious renal lesion. Normal urinary bladder. Stomach/Bowel: Normal appearance of the stomach and duodenum. No bowel dilatation. No evidence for a bowel obstruction. No acute bowel inflammation. There appears to be a small normal appendix. Vascular/Lymphatic: Aorta and main visceral arteries are patent. Portal venous system and splenic vein are patent. Normal appearance of the IVC and renal veins. No significant lymph node enlargement in the abdomen or pelvis. Reproductive: Uterus and bilateral adnexa are unremarkable. Other: Negative for ascites.  Negative for free air. Musculoskeletal: Subcutaneous edema in the abdomen has decreased. No acute bone abnormality. Subcutaneous stranding in the anterior abdomen compatible with changes from laparoscopic cholecystectomy. IMPRESSION: 1. Inflammatory changes in the central abdomen and around the pancreas have slightly decreased but there are organizing fluid collections around the pancreas suggestive for developing pseudocysts. 2. Status post cholecystectomy. No significant fluid in the gallbladder  fossa. No biliary dilatation. 3. Patchy densities at the lung bases are suggestive for atelectasis. Electronically Signed   By: Juliene Balder M.D.   On: 12/03/2023 14:51   US  PELVIC COMPLETE W TRANSVAGINAL AND TORSION R/O Result Date: 12/03/2023 CLINICAL DATA:  [id number] RLQ abdominal pain [id number] EXAM: TRANSABDOMINAL AND TRANSVAGINAL ULTRASOUND OF PELVIS DOPPLER ULTRASOUND OF OVARIES TECHNIQUE: Both transabdominal and transvaginal ultrasound examinations of the pelvis were performed. Transabdominal technique was performed for global imaging of the pelvis including uterus, ovaries, adnexal  regions, and pelvic cul-de-sac. It was necessary to proceed with endovaginal exam following the transabdominal exam to visualize the uterus and ovaries. Color and duplex Doppler ultrasound was utilized to evaluate blood flow to the ovaries. COMPARISON:  11/24/2023 FINDINGS: Uterus Measurements: 7.4 x 3.6 x 4.4 cm = volume: 54 mL. No fibroids or other mass visualized. Endometrium Thickness: 9 mm. No focal abnormality visualized. Small amount of fluid within the endocervical canal. Right ovary Measurements: 2.5 x 2.1 x 2.2 cm = volume: 5.9 mL. Dominant follicle measuring 1.5 cm. Left ovary Obscured by overlying bowel gas. Pulsed Doppler evaluation demonstrates normal low-resistance arterial and venous waveforms in the right ovary. Other findings:  No abnormal free fluid IMPRESSION: 1. The left ovary was not visualized due to overlying bowel gas. Otherwise, nonenlarged right ovary. No sonographic findings to suggest right ovarian torsion, at this time. 2. Normal appearance of the uterus and endometrium. Electronically Signed   By: Rogelia Myers M.D.   On: 12/03/2023 13:56     Assessment/Plan:  18 y.o. 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  - Nonacute abdomen, no appreciable evidence of biliary obstruction, biliary leak or post robotic cholecystectomy complication.  I am not anticipating any imminent need for surgical intervention, or additional workup at this time.  - Diet as tolerated, I do not see any indications for antibiotics at this time.  - DVT prophylaxis, will follow with you.   All of the above findings and recommendations were discussed with the patient and  family(if present), and all of patient's and present family's questions were answered to their expressed satisfaction.  I personally spent a total of 65 minutes in the care of the patient today including preparing to see the patient, getting/reviewing separately obtained history, performing a medically appropriate exam/evaluation, counseling and educating, documenting clinical information in the EHR, independently interpreting results, and communicating results.    Thank you for the opportunity to participate in this patient's care.   -- Honor Leghorn, M.D., FACS 12/03/2023, 4:15 PM

## 2023-12-04 ENCOUNTER — Observation Stay: Payer: MEDICAID

## 2023-12-04 DIAGNOSIS — F419 Anxiety disorder, unspecified: Secondary | ICD-10-CM

## 2023-12-04 DIAGNOSIS — G44229 Chronic tension-type headache, not intractable: Secondary | ICD-10-CM

## 2023-12-04 DIAGNOSIS — F32A Depression, unspecified: Secondary | ICD-10-CM

## 2023-12-04 DIAGNOSIS — K851 Biliary acute pancreatitis without necrosis or infection: Secondary | ICD-10-CM

## 2023-12-04 DIAGNOSIS — K5909 Other constipation: Secondary | ICD-10-CM | POA: Diagnosis not present

## 2023-12-04 DIAGNOSIS — R1084 Generalized abdominal pain: Secondary | ICD-10-CM

## 2023-12-04 DIAGNOSIS — K5900 Constipation, unspecified: Secondary | ICD-10-CM

## 2023-12-04 DIAGNOSIS — R519 Headache, unspecified: Secondary | ICD-10-CM

## 2023-12-04 LAB — LIPASE, BLOOD: Lipase: 48 U/L (ref 11–51)

## 2023-12-04 LAB — COMPREHENSIVE METABOLIC PANEL WITH GFR
ALT: 31 U/L (ref 0–44)
AST: 23 U/L (ref 15–41)
Albumin: 3.1 g/dL — ABNORMAL LOW (ref 3.5–5.0)
Alkaline Phosphatase: 68 U/L (ref 38–126)
Anion gap: 7 (ref 5–15)
BUN: 5 mg/dL — ABNORMAL LOW (ref 6–20)
CO2: 26 mmol/L (ref 22–32)
Calcium: 8.6 mg/dL — ABNORMAL LOW (ref 8.9–10.3)
Chloride: 106 mmol/L (ref 98–111)
Creatinine, Ser: 0.68 mg/dL (ref 0.44–1.00)
GFR, Estimated: >60 mL/min (ref >60)
Glucose, Bld: 94 mg/dL (ref 70–99)
Potassium: 3.7 mmol/L (ref 3.5–5.1)
Sodium: 139 mmol/L (ref 135–145)
Total Bilirubin: 0.6 mg/dL (ref 0.0–1.2)
Total Protein: 6.9 g/dL (ref 6.5–8.1)

## 2023-12-04 LAB — CBC
HCT: 37.6 % (ref 36.0–46.0)
Hemoglobin: 11.7 g/dL — ABNORMAL LOW (ref 12.0–15.0)
MCH: 30.5 pg (ref 26.0–34.0)
MCHC: 31.1 g/dL (ref 30.0–36.0)
MCV: 97.9 fL (ref 80.0–100.0)
Platelets: 304 K/uL (ref 150–400)
RBC: 3.84 MIL/uL — ABNORMAL LOW (ref 3.87–5.11)
RDW: 13.5 % (ref 11.5–15.5)
WBC: 6.5 K/uL (ref 4.0–10.5)
nRBC: 0 % (ref 0.0–0.2)

## 2023-12-04 LAB — PROTIME-INR
INR: 1.1 (ref 0.8–1.2)
Prothrombin Time: 14.8 s (ref 11.4–15.2)

## 2023-12-04 MED ORDER — SODIUM CHLORIDE 0.9 % IV SOLN: INTRAVENOUS | Status: DC

## 2023-12-04 MED ORDER — KETOROLAC TROMETHAMINE 15 MG/ML IJ SOLN
15.0000 mg | Freq: Once | INTRAMUSCULAR | Status: AC
  Administered 2023-12-04: 15 mg via INTRAVENOUS
  Filled 2023-12-04: qty 1

## 2023-12-04 MED ORDER — DIPHENHYDRAMINE HCL 50 MG/ML IJ SOLN
25.0000 mg | Freq: Once | INTRAMUSCULAR | Status: AC
  Administered 2023-12-04: 25 mg via INTRAVENOUS
  Filled 2023-12-04: qty 1

## 2023-12-04 MED ORDER — MAGNESIUM SULFATE 2 GM/50ML IV SOLN
2.0000 g | Freq: Once | INTRAVENOUS | Status: AC
  Administered 2023-12-04: 2 g via INTRAVENOUS
  Filled 2023-12-04: qty 50

## 2023-12-04 MED ORDER — POLYETHYLENE GLYCOL 3350 17 G PO PACK
17.0000 g | PACK | Freq: Two times a day (BID) | ORAL | Status: DC
  Administered 2023-12-04 – 2023-12-05 (×3): 17 g via ORAL
  Filled 2023-12-04 (×3): qty 1

## 2023-12-04 MED ORDER — SODIUM CHLORIDE 0.9 % IV BOLUS
500.0000 mL | Freq: Once | INTRAVENOUS | Status: AC
  Administered 2023-12-04: 500 mL via INTRAVENOUS

## 2023-12-04 MED ORDER — TRAMADOL HCL 50 MG PO TABS
50.0000 mg | ORAL_TABLET | Freq: Four times a day (QID) | ORAL | Status: DC | PRN
  Administered 2023-12-04 – 2023-12-05 (×4): 50 mg via ORAL
  Filled 2023-12-04 (×4): qty 1

## 2023-12-04 NOTE — ED Notes (Signed)
 Pt was sleeping when this nurse went to reassess her pain level after administering morphine . She woke upon my arrival and, when asked, rated her pain as a 1.

## 2023-12-04 NOTE — ED Notes (Signed)
 Pt states that Tylenol  did not relieve her headache. Pt is groaning in pain and repeatedly saying that her head hurts very badly.

## 2023-12-04 NOTE — Progress Notes (Signed)
 Mobility Specialist - Progress Note    12/04/23 1108  Mobility  Activity Ambulated independently;Stood at bedside  Level of Assistance Independent after set-up  Assistive Device None  Distance Ambulated (ft) 25 ft  Range of Motion/Exercises Active  Activity Response Tolerated well  Mobility Referral Yes  Mobility visit 1 Mobility  Mobility Specialist Start Time (ACUTE ONLY) 1038  Mobility Specialist Stop Time (ACUTE ONLY) 1046  Mobility Specialist Time Calculation (min) (ACUTE ONLY) 8 min   Pt resting in bed on RA upon entry. Pt ambulates to bathroom Indep after set-up with no AD. Pt returned to bed and left with needs in reach and bed alarm activated.   Guido Rumble Mobility Specialist 12/04/23, 11:13 AM

## 2023-12-04 NOTE — Assessment & Plan Note (Signed)
 CT scan of the head negative.  Yesterday given Benadryl, Toradol  and magnesium.  Patient has stopped drinking soda after her surgery and likely has caffeine withdrawal headache.  I prescribed Fioricet which helped her headache.

## 2023-12-04 NOTE — Hospital Course (Signed)
 18 y.o. female with medical history significant for recent gallstone pancreatitis s/p robotic laparoscopic cholecystectomy 11/21/2023, complicated with abdominal pain post surgery, acute pancreatitis, right pleural effusion, reactive duodenitis, mild intellectual disability, depression who was discharged from hospital on 11/25/2023 came in today complaining of right lower quadrant and epigastric abdominal pain 6/10 in intensity, intermittent, sharp, associate with some nausea no vomiting.  She did not have any bowel movement for the last 3 or 4 days.  She denies any fever or chills.  Due to persistent abdominal pain patient went to the outpatient clinics couple times.  Did not get better due to persistent abdominal pain and nausea, patient's mother was concerned and brought her to the emergency room for evaluation.   ED Course: Upon arrival to the ED, patient is found to have abdominal pain, afebrile, CT abdomen and pelvis showed organizing fluid collections around the pancreas suggestive of developing pseudocyst.  Hospitalist service was consulted for evaluation for admission for abdominal pain acute pancreatitis, possible pseudocyst.  10/9.  Patient complaining of headache and wanted to get a CAT scan.  CT scan of the head negative.  Patient still having headache this afternoon.  Will give Benadryl, Toradol  and IV magnesium.

## 2023-12-04 NOTE — Assessment & Plan Note (Signed)
 Patient developing a pancreatic pseudocyst.  Patient's status post robotic assisted laparoscopic cholecystectomy.  Will advance diet.

## 2023-12-04 NOTE — ED Notes (Signed)
 Pt states that headache has came back at 7. This nurse reached out to update Dr. Cleatus

## 2023-12-04 NOTE — Assessment & Plan Note (Signed)
 Will need outpatient follow-up

## 2023-12-04 NOTE — Progress Notes (Signed)
 Progress Note   Patient: Teresa Rasmussen FMW:[id number] DOB: 01/11/2006 DOA: 12/03/2023     0 DOS: the patient was seen and examined on 12/04/2023   Brief hospital course: 18 y.o. female with medical history significant for recent gallstone pancreatitis s/p robotic laparoscopic cholecystectomy 11/21/2023, complicated with abdominal pain post surgery, acute pancreatitis, right pleural effusion, reactive duodenitis, mild intellectual disability, depression who was discharged from hospital on 11/25/2023 came in today complaining of right lower quadrant and epigastric abdominal pain 6/10 in intensity, intermittent, sharp, associate with some nausea no vomiting.  She did not have any bowel movement for the last 3 or 4 days.  She denies any fever or chills.  Due to persistent abdominal pain patient went to the outpatient clinics couple times.  Did not get better due to persistent abdominal pain and nausea, patient's mother was concerned and brought her to the emergency room for evaluation.   ED Course: Upon arrival to the ED, patient is found to have abdominal pain, afebrile, CT abdomen and pelvis showed organizing fluid collections around the pancreas suggestive of developing pseudocyst.  Hospitalist service was consulted for evaluation for admission for abdominal pain acute pancreatitis, possible pseudocyst.  10/9.  Patient complaining of headache and wanted to get a CAT scan.  CT scan of the head negative.  Patient still having headache this afternoon.  Will give Benadryl, Toradol  and IV magnesium.  Assessment and Plan: * Abdominal pain Patient developing a pancreatic pseudocyst.  Patient's status post robotic assisted laparoscopic cholecystectomy.  Will advance diet.  Pancreatic pseudocyst/cyst Will need outpatient follow-up.  Headache CT scan of the head negative.  Will give Benadryl, Toradol  and IV magnesium.  Constipation Start MiraLAX  Anxiety and depression On Zoloft          Subjective: Patient came in with abdominal pain but states she has been having a lot of headache recently  Physical Exam: Vitals:   12/04/23 0532 12/04/23 0755 12/04/23 0815 12/04/23 1007  BP:   120/84 (!) 86/64  Pulse:  (!) 51 60 67  Resp:   17 18  Temp: 98.6 F (37 C)  98.1 F (36.7 C)   TempSrc: Oral  Oral   SpO2:  97% 98% 98%  Weight:      Height:       Physical Exam HENT:     Head: Normocephalic.  Eyes:     General: Lids are normal.     Conjunctiva/sclera: Conjunctivae normal.  Cardiovascular:     Rate and Rhythm: Normal rate and regular rhythm.     Heart sounds: Normal heart sounds, S1 normal and S2 normal.  Pulmonary:     Breath sounds: No decreased breath sounds, wheezing, rhonchi or rales.  Abdominal:     Palpations: Abdomen is soft.     Tenderness: There is generalized abdominal tenderness.  Musculoskeletal:     Right lower leg: No swelling.     Left lower leg: No swelling.  Skin:    General: Skin is warm.     Findings: No rash.  Neurological:     Mental Status: She is alert and oriented to person, place, and time.     Data Reviewed: Urine pregnancy test negative, creatinine 0.68, electrolytes normal range, liver function test normal range, white blood cell count 6.5, hemoglobin 11.7, platelet count 304  Disposition: Status is: Observation Treating headache with IV Benadryl, Toradol  and IV magnesium.  If no response may have to try other medications.  Planned Discharge Destination: Home  Time spent: 28 minutes  Author: Charlie Patterson, MD 12/04/2023 2:15 PM  For on call review [email or web address].

## 2023-12-04 NOTE — Assessment & Plan Note (Signed)
Start MiraLAX 

## 2023-12-04 NOTE — Progress Notes (Addendum)
 [facility] SURGICAL PROGRESS NOTE  Hospital Day(s): 0.   Post op day(s): s/p cholecystectomy 09/26  Interval History:  Patient seen and examined No acute events or new complaints overnight.  Patient reports her biggest issue right now is her headache; seems worse at night Abdomen is sore expectedly Intermittently nausea No fever, chills, emesis, juandice  She remains without leukocytosis; WBC 6.5K Hgb to 11.7 - likely dilutional  Renal function normal; sCr - 0.68; UO - unmeasured NO electrolyte derangements Bilirubin level normal; 0.6 Lipase normalized 48 FLD; did not like taste this AM, asking for advancement  Vital signs in last 24 hours: [min-max] current  Temp:  [98 F (36.7 C)-98.6 F (37 C)] 98.1 F (36.7 C) (10/09 0815) Pulse Rate:  [51-90] 60 (10/09 0815) Resp:  [13-22] 17 (10/09 0815) BP: (93-134)/(50-95) 120/84 (10/09 0815) SpO2:  [95 %-100 %] 98 % (10/09 0815) Weight:  [74.8 kg] 74.8 kg (10/08 1727)     Height: 4' 11 (149.9 cm) Weight: 74.8 kg BMI (Calculated): 33.27   Intake/Output last 2 shifts:  No intake/output data recorded.   Physical Exam:  Constitutional: alert, cooperative and no distress  Respiratory: breathing non-labored at rest  Cardiovascular: regular rate and sinus rhythm  Gastrointestinal: soft, does not appear overtly tender this AM, non-distended, no rebound/guarding Integumentary: Laparoscopic incisions are CDI with dermabond, no erythema or drainage   Labs:     Latest Ref Rng & Units 12/04/2023    4:48 AM 12/03/2023   10:00 AM 11/25/2023    5:42 AM  CBC  WBC 4.0 - 10.5 K/uL 6.5  7.3  14.9   Hemoglobin 12.0 - 15.0 g/dL 88.2  87.3  89.1   Hematocrit 36.0 - 46.0 % 37.6  40.0  34.2   Platelets 150 - 400 K/uL 304  378  211       Latest Ref Rng & Units 12/04/2023    4:48 AM 12/03/2023   10:00 AM 11/25/2023    5:42 AM  CMP  Glucose 70 - 99 mg/dL 94  898  90   BUN 6 - 20 mg/dL 5  6  <5   Creatinine 9.55 - 1.00 mg/dL  9.31  9.33  9.18   Sodium 135 - 145 mmol/L 139  138  139   Potassium 3.5 - 5.1 mmol/L 3.7  3.9  4.2   Chloride 98 - 111 mmol/L 106  102  102   CO2 22 - 32 mmol/L 26  26  27    Calcium 8.9 - 10.3 mg/dL 8.6  9.3  8.6   Total Protein 6.5 - 8.1 g/dL 6.9  8.4    Total Bilirubin 0.0 - 1.2 mg/dL 0.6  0.5    Alkaline Phos 38 - 126 U/L 68  80    AST 15 - 41 U/L 23  24    ALT 0 - 44 U/L 31  33       Imaging studies: No new pertinent imaging studies   Assessment/Plan 18 y.o. female with persistent abdominal pain and nausea following episode of gallstone pancreatitis s/p cholecystectomy on 09/26 now with pancreatic pseudocyst   - Okay for diet as tolerated from surgical perspective; Recommend limiting fatty food - Again, no evidence clinically, radiographically, or in laboratory results to suggest biliary injury, retained CBD stone, abscess, hematoma, from cholecystectomy    - Unfortunately, she has developed pancreatic pseudocyst as a likely sequela of her pancreatitis. Nothing acutely to do from a surgical perspective. Typically this will need  4-6 weeks to mature and then evaluated for well defined wall formation, size, and proximity to surrounding structures before drainage (ie: cystogastrostomy) can be attempted by advanced endoscopist. This would require higher level of care. There is no evidence of pancreatic necrosis as well  - No need for emergent surgical intervention   - Okay for bowel regimen as ordered - Monitor abdominal examination; on-going bowel function   - Pain control prn; antiemetics prn   - Okay to mobilize; encouraged this - Further management per primary service  - We will be happy to remain available while in house; Nothing acutely to add from surgical perspective right now    All of the above findings and recommendations were discussed with the patient, and the medical team, and all of patient's questions were answered to her expressed satisfaction.  -- Arthea Platt,  PA-C [facility] 12/04/2023, 8:34 AM M-F: 7am - 4pm

## 2023-12-04 NOTE — Assessment & Plan Note (Signed)
 On Zoloft.

## 2023-12-05 DIAGNOSIS — G4489 Other headache syndrome: Secondary | ICD-10-CM | POA: Diagnosis not present

## 2023-12-05 DIAGNOSIS — K5909 Other constipation: Secondary | ICD-10-CM | POA: Diagnosis not present

## 2023-12-05 DIAGNOSIS — K862 Cyst of pancreas: Secondary | ICD-10-CM | POA: Diagnosis not present

## 2023-12-05 DIAGNOSIS — R1084 Generalized abdominal pain: Secondary | ICD-10-CM | POA: Diagnosis not present

## 2023-12-05 MED ORDER — BUTALBITAL-APAP-CAFFEINE 50-325-40 MG PO TABS: 1.0000 | ORAL_TABLET | Freq: Four times a day (QID) | ORAL | 0 refills | Status: AC | PRN

## 2023-12-05 MED ORDER — SODIUM CHLORIDE 0.9 % IV SOLN
12.5000 mg | Freq: Once | INTRAVENOUS | Status: AC
  Administered 2023-12-05: 12.5 mg via INTRAVENOUS
  Filled 2023-12-05: qty 12.5
  Filled 2023-12-05: qty 0.5

## 2023-12-05 MED ORDER — OXYCODONE HCL 5 MG PO TABS: 5.0000 mg | ORAL_TABLET | Freq: Four times a day (QID) | ORAL | 0 refills | Status: DC | PRN

## 2023-12-05 MED ORDER — BUTALBITAL-APAP-CAFFEINE 50-325-40 MG PO TABS
1.0000 | ORAL_TABLET | ORAL | Status: AC
  Administered 2023-12-05: 1 via ORAL
  Filled 2023-12-05: qty 1

## 2023-12-05 MED ORDER — KETOROLAC TROMETHAMINE 30 MG/ML IJ SOLN
30.0000 mg | Freq: Once | INTRAMUSCULAR | Status: AC
Start: 2023-12-05 — End: 2023-12-05
  Administered 2023-12-05: 30 mg via INTRAVENOUS
  Filled 2023-12-05: qty 1

## 2023-12-05 MED ORDER — METOCLOPRAMIDE HCL 5 MG/ML IJ SOLN
10.0000 mg | Freq: Once | INTRAMUSCULAR | Status: AC
  Administered 2023-12-05: 10 mg via INTRAVENOUS
  Filled 2023-12-05: qty 2

## 2023-12-05 MED ORDER — MIRALAX 17 GM/SCOOP PO POWD: 17.0000 g | Freq: Every day | ORAL | 0 refills | Status: AC | PRN

## 2023-12-05 MED ORDER — BUTALBITAL-APAP-CAFFEINE 50-325-40 MG PO TABS: 1.0000 | ORAL_TABLET | Freq: Four times a day (QID) | ORAL | Status: DC | PRN

## 2023-12-05 NOTE — Discharge Summary (Signed)
 Physician Discharge Summary   Patient: Teresa Rasmussen MRN: [id number] DOB: 10-19-2005  Admit date:     12/03/2023  Discharge date: 12/05/23  Discharge Physician: Charlie Patterson   PCP: [facility]   Recommendations at discharge:   Follow-up PCP 5 days Keep appointment with general surgery Will need follow-up CT scan and likely endoscopic ultrasound.  Given name of gastroenterologist in [place].  Discharge Diagnoses: Principal Problem:   Abdominal pain Active Problems:   Pancreatic pseudocyst/cyst   Acute biliary pancreatitis without infection or necrosis   Headache   Constipation   Anxiety and depression  Resolved Problems:   * No resolved hospital problems. *  Hospital Course: 18 y.o. female with medical history significant for recent gallstone pancreatitis s/p robotic laparoscopic cholecystectomy 11/21/2023, complicated with abdominal pain post surgery, acute pancreatitis, right pleural effusion, reactive duodenitis, mild intellectual disability, depression who was discharged from hospital on 11/25/2023 came in today complaining of right lower quadrant and epigastric abdominal pain 6/10 in intensity, intermittent, sharp, associate with some nausea no vomiting.  She did not have any bowel movement for the last 3 or 4 days.  She denies any fever or chills.  Due to persistent abdominal pain patient went to the outpatient clinics couple times.  Did not get better due to persistent abdominal pain and nausea, patient's mother was concerned and brought her to the emergency room for evaluation.   ED Course: Upon arrival to the ED, patient is found to have abdominal pain, afebrile, CT abdomen and pelvis showed organizing fluid collections around the pancreas suggestive of developing pseudocyst.  Hospitalist service was consulted for evaluation for admission for abdominal pain acute pancreatitis, possible pseudocyst.  10/9.  Patient complaining of headache and  wanted to get a CAT scan.  CT scan of the head negative.  Patient still having headache this afternoon.  Will give Benadryl, Toradol  and IV magnesium.  Assessment and Plan: * Abdominal pain Patient developing a pancreatic pseudocyst.  Patient's status post robotic assisted laparoscopic cholecystectomy.  Tolerated advance diet.  Cleared by general surgery to go home.  Did prescribe a few pain medications of oxycodone .  Pancreatic pseudocyst/cyst Will need outpatient follow-up.  Given the name of a gastroenterologist and [facility] that does endoscopic ultrasounds.  Likely will need a repeat CT scan as outpatient.  Headache CT scan of the head negative.  Yesterday given Benadryl, Toradol  and magnesium.  Patient has stopped drinking soda after her surgery and likely has caffeine withdrawal headache.  I prescribed Fioricet which helped her headache.  Constipation Continue MiraLAX  Anxiety and depression On Zoloft          Consultants: General Surgery Procedures performed: None Disposition: Home Diet recommendation:  Low-fat diet DISCHARGE MEDICATION: Allergies as of 12/05/2023   No Known Allergies      Medication List     STOP taking these medications    ascorbic acid  500 MG tablet Commonly known as: VITAMIN C    cephALEXin 500 MG capsule Commonly known as: KEFLEX   Ferrex 150 150 MG capsule Generic drug: iron  polysaccharides   ibuprofen  600 MG tablet Commonly known as: ADVIL        TAKE these medications    Acetaminophen  Extra Strength 500 MG Tabs Take 1-2 tablets by mouth 3 (three) times daily as needed.   butalbital-acetaminophen -caffeine 50-325-40 MG tablet Commonly known as: FIORICET Take 1 tablet by mouth every 6 (six) hours as needed for headache.   cyanocobalamin  1000 MCG tablet Commonly known as: VITAMIN B12  Take 1 tablet (1,000 mcg total) by mouth daily.   MiraLax 17 GM/SCOOP powder Generic drug: polyethylene glycol powder Take 17 g by mouth  daily as needed for moderate constipation. What changed:  when to take this reasons to take this   omeprazole 20 MG capsule Commonly known as: PRILOSEC Take 20 mg by mouth 2 (two) times daily.   oxyCODONE  5 MG immediate release tablet Commonly known as: Roxicodone  Take 1 tablet (5 mg total) by mouth every 6 (six) hours as needed for severe pain (pain score 7-10).   sertraline  50 MG tablet Commonly known as: ZOLOFT  Take 50 mg by mouth daily.   Vitamin D  (Ergocalciferol ) 1.25 MG (50000 UNIT) Caps capsule Commonly known as: DRISDOL  Take 1 capsule (50,000 Units total) by mouth every 7 (seven) days.        Follow-up Information     [facility] Follow up.   Specialty: General Practice Why: hospital follow up  Patient is making own follow up appt Contact information: [address] [place] [address] [phone]         [name], MD Follow up in 8 week(s).   Specialty: Gastroenterology Why: Patient is making own follow up appt  pancreatic pseudocyst               Discharge Exam: Filed Weights   12/03/23 1727  Weight: 74.8 kg   Physical Exam HENT:     Head: Normocephalic.  Eyes:     General: Lids are normal.     Conjunctiva/sclera: Conjunctivae normal.  Cardiovascular:     Rate and Rhythm: Normal rate and regular rhythm.     Heart sounds: Normal heart sounds, S1 normal and S2 normal.  Pulmonary:     Breath sounds: No decreased breath sounds, wheezing, rhonchi or rales.  Abdominal:     Palpations: Abdomen is soft.     Tenderness: There is generalized abdominal tenderness.  Musculoskeletal:     Right lower leg: No swelling.     Left lower leg: No swelling.  Skin:    General: Skin is warm.     Findings: No rash.  Neurological:     Mental Status: She is alert and oriented to person, place, and time.      Condition at discharge: stable  The results of significant diagnostics from this  hospitalization (including imaging, microbiology, ancillary and laboratory) are listed below for reference.   Imaging Studies: CT HEAD WO CONTRAST ( ) Result Date: 12/04/2023 EXAM: CT HEAD WITHOUT CONTRAST 12/04/2023 10:56:01 AM TECHNIQUE: CT of the head was performed without the administration of intravenous contrast. Automated exposure control, iterative reconstruction, and/or weight based adjustment of the mA/kV was utilized to reduce the radiation dose to as low as reasonably achievable. COMPARISON: None available. CLINICAL HISTORY: Sudden, severe headache. FINDINGS: BRAIN AND VENTRICLES: No acute hemorrhage, evidence of acute infarct, hydrocephalus, extra-axial collection, mass effect, or midline shift. ORBITS: No acute abnormality. SINUSES: No acute abnormality. SOFT TISSUES AND SKULL: No acute soft tissue abnormality or skull fracture. IMPRESSION: 1. Negative head CT. Electronically signed by: Dasie Hamburg MD 12/04/2023 11:16 AM EDT RP Workstation: [id number]   CT ABDOMEN PELVIS W CONTRAST Result Date: 12/03/2023 CLINICAL DATA:  Right lower quadrant pain. Sharp right abdominal pain. Recent pancreatitis and cholecystectomy. EXAM: CT ABDOMEN AND PELVIS WITH CONTRAST TECHNIQUE: Multidetector CT imaging of the abdomen and pelvis was performed using the standard protocol following bolus administration of intravenous contrast. RADIATION DOSE REDUCTION: This exam  was performed according to the departmental dose-optimization program which includes automated exposure control, adjustment of the mA and/or kV according to patient size and/or use of iterative reconstruction technique. CONTRAST:  OMNIPAQUE  IOHEXOL  300 MG/ML  SOLN COMPARISON:  CT abdomen pelvis 11/24/2023 FINDINGS: Lower chest: Patchy densities at the lung bases are suggestive for atelectasis. No pleural effusions. Hepatobiliary: Gallbladder has been removed. No significant fluid in the gallbladder fossa. No biliary dilatation. Main portal  venous system is patent. No discrete liver lesion. Pancreas: Fluid cephalad to the pancreatic body is more organized and suggestive for a developing pseudocyst. Collection measures 5.0 x 3.0 cm on image 27/2. Additional fluid anterior to the caudal aspect of the pancreas on image 34. The stranding and inflammatory changes in the central abdomen and around the pancreas have decreased compared to the previous examination. There is no significant pancreatic duct dilatation. Enhancement of the pancreas without necrosis. Spleen: Normal in size without focal abnormality. Adrenals/Urinary Tract: Normal adrenal glands. Normal appearance of both kidneys without hydronephrosis. No suspicious renal lesion. Normal urinary bladder. Stomach/Bowel: Normal appearance of the stomach and duodenum. No bowel dilatation. No evidence for a bowel obstruction. No acute bowel inflammation. There appears to be a small normal appendix. Vascular/Lymphatic: Aorta and main visceral arteries are patent. Portal venous system and splenic vein are patent. Normal appearance of the IVC and renal veins. No significant lymph node enlargement in the abdomen or pelvis. Reproductive: Uterus and bilateral adnexa are unremarkable. Other: Negative for ascites.  Negative for free air. Musculoskeletal: Subcutaneous edema in the abdomen has decreased. No acute bone abnormality. Subcutaneous stranding in the anterior abdomen compatible with changes from laparoscopic cholecystectomy. IMPRESSION: 1. Inflammatory changes in the central abdomen and around the pancreas have slightly decreased but there are organizing fluid collections around the pancreas suggestive for developing pseudocysts. 2. Status post cholecystectomy. No significant fluid in the gallbladder fossa. No biliary dilatation. 3. Patchy densities at the lung bases are suggestive for atelectasis. Electronically Signed   By: Juliene Balder M.D.   On: 12/03/2023 14:51   US  PELVIC COMPLETE W TRANSVAGINAL AND  TORSION R/O Result Date: 12/03/2023 CLINICAL DATA:  [id number] RLQ abdominal pain [id number] EXAM: TRANSABDOMINAL AND TRANSVAGINAL ULTRASOUND OF PELVIS DOPPLER ULTRASOUND OF OVARIES TECHNIQUE: Both transabdominal and transvaginal ultrasound examinations of the pelvis were performed. Transabdominal technique was performed for global imaging of the pelvis including uterus, ovaries, adnexal regions, and pelvic cul-de-sac. It was necessary to proceed with endovaginal exam following the transabdominal exam to visualize the uterus and ovaries. Color and duplex Doppler ultrasound was utilized to evaluate blood flow to the ovaries. COMPARISON:  11/24/2023 FINDINGS: Uterus Measurements: 7.4 x 3.6 x 4.4 cm = volume: 54 mL. No fibroids or other mass visualized. Endometrium Thickness: 9 mm. No focal abnormality visualized. Small amount of fluid within the endocervical canal. Right ovary Measurements: 2.5 x 2.1 x 2.2 cm = volume: 5.9 mL. Dominant follicle measuring 1.5 cm. Left ovary Obscured by overlying bowel gas. Pulsed Doppler evaluation demonstrates normal low-resistance arterial and venous waveforms in the right ovary. Other findings:  No abnormal free fluid IMPRESSION: 1. The left ovary was not visualized due to overlying bowel gas. Otherwise, nonenlarged right ovary. No sonographic findings to suggest right ovarian torsion, at this time. 2. Normal appearance of the uterus and endometrium. Electronically Signed   By: Rogelia Myers M.D.   On: 12/03/2023 13:56   CT CHEST ABDOMEN PELVIS W CONTRAST Result Date: 11/24/2023 EXAM: CT CHEST, ABDOMEN AND PELVIS WITH  CONTRAST 11/24/2023 08:02:50 PM TECHNIQUE: CT of the chest, abdomen and pelvis was performed with the administration of 75 mL of iohexol  (OMNIPAQUE ) 350 MG/ML injection. Multiplanar reformatted images are provided for review. Automated exposure control, iterative reconstruction, and/or weight based adjustment of the mA/kV was utilized to reduce the radiation dose to as  low as reasonably achievable. Per MD no oral contrast. COMPARISON: CT abdomen and pelvis completed on 11/14/2015 and MRI abdomen 11/19/2023. CLINICAL HISTORY: Sepsis. Sepsis; Per MD no oral contrast.; Gallbladder surgery last week. FINDINGS: CHEST: MEDIASTINUM AND LYMPH NODES: Heart and pericardium are unremarkable. The central airways are clear. No mediastinal, hilar or axillary lymphadenopathy. LUNGS AND PLEURA: Small bilateral pleural effusions. Associated atelectasis in the lower lobes. No pneumothorax. ABDOMEN AND PELVIS: LIVER: The liver is unremarkable. GALLBLADDER AND BILE DUCTS: Status post cholecystectomy. No biliary ductal dilatation. SPLEEN: No acute abnormality. PANCREAS: Extensive fluid and stranding about the pancreas compatible with acute pancreatitis. This is grossly similar to MRI 11/18/2021, given differences in technique. No pancreatic ductal dilation. No evidence of pancreatic necrosis. No organized fluid collection. ADRENAL GLANDS: No acute abnormality. KIDNEYS, URETERS AND BLADDER: No stones in the kidneys or ureters. No hydronephrosis. No perinephric or periureteral stranding. Urinary bladder is unremarkable. GI AND BOWEL: Reactive inflammation of the duodenum. Stomach demonstrates no acute abnormality. There is no bowel obstruction. REPRODUCTIVE ORGANS: No acute abnormality. PERITONEUM AND RETROPERITONEUM: Free fluid tracks inferiorly in the anterior renal space. No free intraperitoneal air. VASCULATURE: Aorta is normal in caliber. ABDOMINAL AND PELVIS LYMPH NODES: No lymphadenopathy. REPRODUCTIVE ORGANS: No acute abnormality. BONES AND SOFT TISSUES: Subcutaneous and intramuscular gas in the left abdominal wall related to recent cholecystectomy. No acute osseous abnormality. IMPRESSION: 1. Acute pancreatitis, grossly similar to prior MRI. 2. Reactive duodenal inflammation secondary to pancreatitis. 3. Small bilateral pleural effusions with associated lower lobe atelectasis. Pneumonia is  difficult to exclude. 4. Postoperative subcutaneous and intramuscular gas in the left abdominal wall related to recent cholecystectomy. Electronically signed by: Norman Gatlin MD 11/24/2023 08:14 PM EDT RP Workstation: [id number]   US  Abdomen Limited Result Date: 11/24/2023 CLINICAL DATA:  Recent cholecystectomy.  Leukocytosis. EXAM: LIMITED ABDOMEN ULTRASOUND TECHNIQUE: Limited ultrasound survey was performed in all four abdominal quadrants. COMPARISON:  None Available. FINDINGS: Small amount of free fluid seen in both upper quadrants. Small right pleural effusion also seen. A simple appearing fluid collection is seen in the right abdomen adjacent to the kidney which measures 4.8 x 3.1 cm. IMPRESSION: Small amount of ascites in both upper quadrants. Small right pleural effusion. 4.8 cm simple appearing fluid collection in right abdomen adjacent to the kidney. Electronically Signed   By: Norleen DELENA Kil M.D.   On: 11/24/2023 12:01   MR 3D Recon At Scanner Result Date: 11/19/2023 CLINICAL DATA:  Cholelithiasis. Follow-up MRCP inpatient with cholelithiasis. EXAM: MRI ABDOMEN WITHOUT AND WITH CONTRAST (INCLUDING MRCP) TECHNIQUE: Multiplanar multisequence MR imaging of the abdomen was performed both before and after the administration of intravenous contrast. Heavily T2-weighted images of the biliary and pancreatic ducts were obtained, and three-dimensional MRCP images were rendered by post processing. CONTRAST:  7mL GADAVIST  GADOBUTROL  1 MMOL/ML IV SOLN COMPARISON:  Ultrasound abdomen from 11/18/2023. FINDINGS: Lower chest: Unremarkable MR appearance to the lung bases. No pleural effusion. No pericardial effusion. Normal heart size. Hepatobiliary: The liver is normal in size. Noncirrhotic configuration. No focal mass. No intrahepatic or extrahepatic bile duct dilatation. No choledocholithiasis. Physiologically distended gallbladder. Small-to-moderate volume dependent gallstones and sludge. No abnormal wall  thickening. There is  trace pericholecystic fat stranding and fluid near the neck, which is likely secondary to pancreatitis described below. Pancreas: There is bulky and slightly heterogeneous pancreas exhibiting moderate peripancreatic fat stranding and fluid which extends along the bilateral paracolic gutters. No walled-off abscess. There is normal inhomogeneous enhancement of the entire pancreas, thereby excluding pancreatic necrosis. No suspicious pancreatic lesion. Main pancreatic duct is not dilated. The splenic vein, superior mesenteric vein and portal veins are patent and without significant stenosis. Spleen:  Within normal limits in size and appearance. No focal mass. Adrenals/Urinary Tract: Unremarkable adrenal glands. No hydroureteronephrosis. No suspicious renal mass. Stomach/Bowel: Visualized portions within the abdomen are unremarkable. No disproportionate dilation of bowel loops. Vascular/Lymphatic: No pathologically enlarged lymph nodes identified. No abdominal aortic aneurysm demonstrated. There is mild ascites. Other: Limited evaluation of uterus and bilateral ovaries on coronal images, which appears grossly within normal limits. Musculoskeletal: No suspicious bone lesions identified. IMPRESSION: 1. Findings compatible with acute pancreatitis. No walled-off abscess or pancreatic necrosis. 2. Cholelithiasis without acute cholecystitis. No intra or extrahepatic bile duct dilation. No choledocholithiasis. 3. Mild ascites. Electronically Signed   By: Ree Molt M.D.   On: 11/19/2023 11:20   MR ABDOMEN MRCP W WO CONTAST Result Date: 11/19/2023 CLINICAL DATA:  Cholelithiasis. Follow-up MRCP inpatient with cholelithiasis. EXAM: MRI ABDOMEN WITHOUT AND WITH CONTRAST (INCLUDING MRCP) TECHNIQUE: Multiplanar multisequence MR imaging of the abdomen was performed both before and after the administration of intravenous contrast. Heavily T2-weighted images of the biliary and pancreatic ducts were obtained,  and three-dimensional MRCP images were rendered by post processing. CONTRAST:  7mL GADAVIST  GADOBUTROL  1 MMOL/ML IV SOLN COMPARISON:  Ultrasound abdomen from 11/18/2023. FINDINGS: Lower chest: Unremarkable MR appearance to the lung bases. No pleural effusion. No pericardial effusion. Normal heart size. Hepatobiliary: The liver is normal in size. Noncirrhotic configuration. No focal mass. No intrahepatic or extrahepatic bile duct dilatation. No choledocholithiasis. Physiologically distended gallbladder. Small-to-moderate volume dependent gallstones and sludge. No abnormal wall thickening. There is trace pericholecystic fat stranding and fluid near the neck, which is likely secondary to pancreatitis described below. Pancreas: There is bulky and slightly heterogeneous pancreas exhibiting moderate peripancreatic fat stranding and fluid which extends along the bilateral paracolic gutters. No walled-off abscess. There is normal inhomogeneous enhancement of the entire pancreas, thereby excluding pancreatic necrosis. No suspicious pancreatic lesion. Main pancreatic duct is not dilated. The splenic vein, superior mesenteric vein and portal veins are patent and without significant stenosis. Spleen:  Within normal limits in size and appearance. No focal mass. Adrenals/Urinary Tract: Unremarkable adrenal glands. No hydroureteronephrosis. No suspicious renal mass. Stomach/Bowel: Visualized portions within the abdomen are unremarkable. No disproportionate dilation of bowel loops. Vascular/Lymphatic: No pathologically enlarged lymph nodes identified. No abdominal aortic aneurysm demonstrated. There is mild ascites. Other: Limited evaluation of uterus and bilateral ovaries on coronal images, which appears grossly within normal limits. Musculoskeletal: No suspicious bone lesions identified. IMPRESSION: 1. Findings compatible with acute pancreatitis. No walled-off abscess or pancreatic necrosis. 2. Cholelithiasis without acute  cholecystitis. No intra or extrahepatic bile duct dilation. No choledocholithiasis. 3. Mild ascites. Electronically Signed   By: Ree Molt M.D.   On: 11/19/2023 08:58   US  ABDOMEN LIMITED RUQ (LIVER/GB) Result Date: 11/18/2023 CLINICAL DATA:  RUQ pain, elevated LFTs EXAM: ULTRASOUND ABDOMEN LIMITED RIGHT UPPER QUADRANT COMPARISON:  04/20/2018 FINDINGS: Gallbladder: Small volume biliary sludge with a few small gallstones. No wall thickening or pericholecystic fluid. No sonographic Murphy's sign noted by sonographer. Common bile duct: Diameter: 4 mm Liver: Normal echogenicity.  No focal lesion identified. No intrahepatic biliary ductal dilation. Portal vein is patent on color Doppler imaging with normal direction of blood flow towards the liver. Other: None. IMPRESSION: Cholecystolithiasis with small volume biliary sludge. No changes of acute cholecystitis. Electronically Signed   By: Rogelia Myers M.D.   On: 11/18/2023 17:25    Microbiology: Results for orders placed or performed during the hospital encounter of 11/18/23  Culture, blood (Routine X 2) w Reflex to ID Panel     Status: None   Collection Time: 11/20/23  9:29 AM   Specimen: BLOOD  Result Value Ref Range Status   Specimen Description BLOOD BLOOD LEFT ARM  Final   Special Requests   Final    BOTTLES DRAWN AEROBIC AND ANAEROBIC Blood Culture results may not be optimal due to an inadequate volume of blood received in culture bottles   Culture   Final    NO GROWTH 5 DAYS Performed at [facility], [address] [place], [place] [address]    Report Status 11/25/2023 FINAL  Final  Culture, blood (Routine X 2) w Reflex to ID Panel     Status: None   Collection Time: 11/20/23  9:33 AM   Specimen: BLOOD  Result Value Ref Range Status   Specimen Description BLOOD BLOOD LEFT HAND  Final   Special Requests   Final    BOTTLES DRAWN AEROBIC AND ANAEROBIC Blood Culture results may not be optimal due to an inadequate volume of  blood received in culture bottles   Culture   Final    NO GROWTH 5 DAYS Performed at [facility], [address] [place], [place] [address]    Report Status 11/25/2023 FINAL  Final    Labs: CBC: Recent Labs  Lab 12/03/23 1000 12/04/23 0448  WBC 7.3 6.5  HGB 12.6 11.7*  HCT 40.0 37.6  MCV 96.9 97.9  PLT 378 304   Basic Metabolic Panel: Recent Labs  Lab 12/03/23 1000 12/04/23 0448  NA 138 139  K 3.9 3.7  CL 102 106  CO2 26 26  GLUCOSE 101* 94  BUN 6 5*  CREATININE 0.66 0.68  CALCIUM 9.3 8.6*   Liver Function Tests: Recent Labs  Lab 12/03/23 1000 12/04/23 0448  AST 24 23  ALT 33 31  ALKPHOS 80 68  BILITOT 0.5 0.6  PROT 8.4* 6.9  ALBUMIN 3.7 3.1*   CBG: No results for input(s): GLUCAP in the last 168 hours.  Discharge time spent: greater than 30 minutes.  Signed: Charlie Patterson, MD [facility] 12/05/2023

## 2023-12-05 NOTE — Plan of Care (Signed)
  Problem: Clinical Measurements: Goal: Ability to maintain clinical measurements within normal limits will improve Outcome: Progressing   Problem: Nutrition: Goal: Adequate nutrition will be maintained Outcome: Progressing   Problem: Pain Managment: Goal: General experience of comfort will improve and/or be controlled Outcome: Progressing

## 2023-12-05 NOTE — Plan of Care (Signed)
  Problem: Education: Goal: Knowledge of General Education information will improve Description: Including pain rating scale, medication(s)/side effects and non-pharmacologic comfort measures Outcome: Progressing   Problem: Health Behavior/Discharge Planning: Goal: Ability to manage health-related needs will improve Outcome: Progressing   Problem: Activity: Goal: Risk for activity intolerance will decrease Outcome: Progressing   Problem: Nutrition: Goal: Adequate nutrition will be maintained Outcome: Progressing   Problem: Coping: Goal: Level of anxiety will decrease Outcome: Progressing   Problem: Elimination: Goal: Will not experience complications related to bowel motility Outcome: Progressing   Problem: Pain Managment: Goal: General experience of comfort will improve and/or be controlled Outcome: Progressing   Problem: Safety: Goal: Ability to remain free from injury will improve Outcome: Progressing   Problem: Skin Integrity: Goal: Risk for impaired skin integrity will decrease Outcome: Progressing

## 2023-12-10 ENCOUNTER — Other Ambulatory Visit: Payer: Self-pay

## 2023-12-10 ENCOUNTER — Telehealth: Payer: Self-pay

## 2023-12-10 ENCOUNTER — Ambulatory Visit (INDEPENDENT_AMBULATORY_CARE_PROVIDER_SITE_OTHER): Payer: MEDICAID | Admitting: Physician Assistant

## 2023-12-10 ENCOUNTER — Encounter: Payer: Self-pay | Admitting: Physician Assistant

## 2023-12-10 VITALS — BP 112/74 | HR 82 | Temp 98.6°F | Wt 154.2 lb

## 2023-12-10 DIAGNOSIS — K863 Pseudocyst of pancreas: Secondary | ICD-10-CM

## 2023-12-10 DIAGNOSIS — Z09 Encounter for follow-up examination after completed treatment for conditions other than malignant neoplasm: Secondary | ICD-10-CM

## 2023-12-10 DIAGNOSIS — K851 Biliary acute pancreatitis without necrosis or infection: Secondary | ICD-10-CM

## 2023-12-10 NOTE — Patient Instructions (Signed)

## 2023-12-10 NOTE — Telephone Encounter (Signed)
 Faxed referral to [facility] with Dr. Lorrene Rase at [phone].

## 2023-12-10 NOTE — Progress Notes (Signed)
 [facility] POST-OP OFFICE VISIT  12/10/2023  HPI: Teresa Rasmussen is a 18 y.o. female s/p cholecystectomy for gallstone pancreatitis on 09/26, complicated by readmission for pancreatic pseudocyst   She is doing much better Her abdominal pain is improving She does get discomfort with eating; not following low fat diet recommendations No fever, chills, nausea, emesis  Incisions are well healed  No other complaints   Vital signs: BP 112/74   Pulse 82   Temp 98.6 F (37 C) (Oral)   Wt 154 lb 3.2 oz (69.9 kg)   LMP  (LMP Unknown) Comment: Patient has a Nextplanon  SpO2 98%   BMI 31.14 kg/m    Physical Exam: Constitutional: Well appearing female, NAD Abdomen: Soft, non-tender, non-distended, no rebound/guarding Skin: Laparoscopic incisions are healing well, no erythema or drainage   Assessment/Plan: This is a 19 y.o. female s/p cholecystectomy for gallstone pancreatitis on 09/26, complicated by readmission for pancreatic pseudocyst    - Reviewed recommendations for low fat diet after cholecystectomy and as she recovers from pancreatitis  - Pain control prn  - Reviewed wound care recommendation  - Reviewed lifting restrictions; 4 weeks total  - I will send her referral to GI at tertiary center; likely [facility]; for evaluation and management of her pancreatitic pseudocyst. Patient, and her mother, understand that she may need repeat imaging to reassess this. Often times this needs time to mature (~6 weeks) and needs to be a certain size and anatomic location to consider drainage (Ie; cystogastrostomy)  - She can follow up on as needed basis; She understands to call with questions/concerns  -- Arthea Platt, PA-C [facility] 12/10/2023, 3:15 PM M-F: 7am - 4pm
